# Patient Record
Sex: Female | Born: 1940 | Race: Black or African American | Hispanic: No | Marital: Single | Smoking: Never smoker
Health system: Southern US, Community
[De-identification: ages and names within clinical notes are randomized; demographics above are authoritative.]

## PROBLEM LIST (undated history)

## (undated) DIAGNOSIS — I5032 Chronic diastolic (congestive) heart failure: Secondary | ICD-10-CM

## (undated) DIAGNOSIS — Z9119 Patient's noncompliance with other medical treatment and regimen: Secondary | ICD-10-CM

## (undated) DIAGNOSIS — G629 Polyneuropathy, unspecified: Secondary | ICD-10-CM

## (undated) DIAGNOSIS — R748 Abnormal levels of other serum enzymes: Secondary | ICD-10-CM

## (undated) DIAGNOSIS — N189 Chronic kidney disease, unspecified: Secondary | ICD-10-CM

## (undated) DIAGNOSIS — E7800 Pure hypercholesterolemia, unspecified: Secondary | ICD-10-CM

## (undated) DIAGNOSIS — M1990 Unspecified osteoarthritis, unspecified site: Secondary | ICD-10-CM

## (undated) DIAGNOSIS — E119 Type 2 diabetes mellitus without complications: Secondary | ICD-10-CM

## (undated) DIAGNOSIS — Z7982 Long term (current) use of aspirin: Secondary | ICD-10-CM

## (undated) DIAGNOSIS — R0989 Other specified symptoms and signs involving the circulatory and respiratory systems: Secondary | ICD-10-CM

## (undated) DIAGNOSIS — F411 Generalized anxiety disorder: Secondary | ICD-10-CM

## (undated) DIAGNOSIS — I739 Peripheral vascular disease, unspecified: Secondary | ICD-10-CM

## (undated) DIAGNOSIS — M109 Gout, unspecified: Secondary | ICD-10-CM

## (undated) DIAGNOSIS — K219 Gastro-esophageal reflux disease without esophagitis: Secondary | ICD-10-CM

## (undated) DIAGNOSIS — Z91199 Patient's noncompliance with other medical treatment and regimen due to unspecified reason: Secondary | ICD-10-CM

## (undated) DIAGNOSIS — R0602 Shortness of breath: Secondary | ICD-10-CM

## (undated) DIAGNOSIS — I059 Rheumatic mitral valve disease, unspecified: Secondary | ICD-10-CM

## (undated) DIAGNOSIS — R51 Headache: Secondary | ICD-10-CM

## (undated) DIAGNOSIS — I10 Essential (primary) hypertension: Secondary | ICD-10-CM

## (undated) DIAGNOSIS — R011 Cardiac murmur, unspecified: Secondary | ICD-10-CM

## (undated) HISTORY — DX: Rheumatic mitral valve disease, unspecified: I05.9

## (undated) HISTORY — PX: OTHER SURGICAL HISTORY: SHX169

## (undated) HISTORY — DX: Patient's noncompliance with other medical treatment and regimen: Z91.19

## (undated) HISTORY — DX: Essential (primary) hypertension: I10

## (undated) HISTORY — DX: Generalized anxiety disorder: F41.1

## (undated) HISTORY — PX: CARDIAC CATHETERIZATION: SHX172

## (undated) HISTORY — DX: Long term (current) use of aspirin: Z79.82

## (undated) HISTORY — DX: Type 2 diabetes mellitus without complications: E11.9

## (undated) HISTORY — DX: Pure hypercholesterolemia, unspecified: E78.00

## (undated) HISTORY — DX: Cardiac murmur, unspecified: R01.1

## (undated) HISTORY — PX: ABDOMINAL HYSTERECTOMY: SHX81

## (undated) HISTORY — DX: Patient's noncompliance with other medical treatment and regimen due to unspecified reason: Z91.199

## (undated) HISTORY — DX: Abnormal levels of other serum enzymes: R74.8

## (undated) HISTORY — PX: COLONOSCOPY: SHX174

---

## 2012-01-30 DIAGNOSIS — R012 Other cardiac sounds: Secondary | ICD-10-CM

## 2012-01-30 DIAGNOSIS — R7989 Other specified abnormal findings of blood chemistry: Secondary | ICD-10-CM

## 2012-01-31 DIAGNOSIS — I219 Acute myocardial infarction, unspecified: Secondary | ICD-10-CM

## 2012-02-01 DIAGNOSIS — R0602 Shortness of breath: Secondary | ICD-10-CM

## 2012-02-01 DIAGNOSIS — I10 Essential (primary) hypertension: Secondary | ICD-10-CM

## 2012-02-02 DIAGNOSIS — I214 Non-ST elevation (NSTEMI) myocardial infarction: Secondary | ICD-10-CM

## 2012-02-21 ENCOUNTER — Encounter: Payer: Self-pay | Admitting: Cardiovascular Disease

## 2012-02-22 ENCOUNTER — Ambulatory Visit (INDEPENDENT_AMBULATORY_CARE_PROVIDER_SITE_OTHER): Payer: 59 | Admitting: Cardiovascular Disease

## 2012-02-22 ENCOUNTER — Encounter: Payer: Self-pay | Admitting: Cardiovascular Disease

## 2012-02-22 DIAGNOSIS — I252 Old myocardial infarction: Secondary | ICD-10-CM

## 2012-02-22 DIAGNOSIS — I10 Essential (primary) hypertension: Secondary | ICD-10-CM

## 2012-02-22 NOTE — Patient Instructions (Signed)
Your physician recommends that you schedule a follow-up appointment as needed.   Your physician recommends that you continue on your current medications as directed. Please refer to the Current Medication list given to you today.  

## 2012-02-24 ENCOUNTER — Encounter: Payer: Self-pay | Admitting: Cardiovascular Disease

## 2012-02-24 DIAGNOSIS — I252 Old myocardial infarction: Secondary | ICD-10-CM | POA: Insufficient documentation

## 2012-02-24 DIAGNOSIS — I10 Essential (primary) hypertension: Secondary | ICD-10-CM | POA: Insufficient documentation

## 2012-02-24 NOTE — Progress Notes (Signed)
HPI  This is a 71 year old female who is here today for followup visit. She was seen recently at [facility] where she presented with hypertensive urgency. She had a similar presentation one week prior at [facility]. Her systolic blood pressure was close to 200. Her cardiac enzymes were mildly elevated and that was felt to be due to uncontrolled hypertension. She had an echocardiogram done which showed normal LV systolic function with mild LVOT obstruction. Supposedly, she had cardiac catheterization in 2007 in [place] which showed no significant coronary artery disease. She also informed me that she had a stress test done last year which was also unremarkable. During her hospitalization, we took her off clonidine. Labetalol was switched to carvedilol and hydrochlorothiazide was switched to chlorthalidone. Also hydralazine was added. Her blood pressure has been more controlled since then. She saw her primary care physician Dr. Nelson Chimes  who switched her back from carvedilol to labetalol. Overall, she is feeling reasonably well. She has chronic complaints of headache and dizziness. She also has some nausea. It appears that she was found to have gallstones recently in her abdominal ultrasound.  No Known Allergies   Current Outpatient Prescriptions on File Prior to Visit  Medication Sig Dispense Refill  . aspirin 81 MG tablet Take 81 mg by mouth daily.      Marland Kitchen docusate sodium (COLACE) 100 MG capsule Take 100 mg by mouth at bedtime as needed.       . ETODOLAC PO Take 500 mg by mouth 2 (two) times daily.       . hydrALAZINE (APRESOLINE) 50 MG tablet Take 50 mg by mouth 3 (three) times daily.      . potassium chloride SA (K-DUR,KLOR-CON) 20 MEQ tablet Take 20 mEq by mouth 2 (two) times daily.      Marland Kitchen pyridoxine (B-6) 100 MG tablet Take 100 mg by mouth daily.      . traMADol (ULTRAM) 50 MG tablet Take 50 mg by mouth every 6 (six) hours as needed.      . cloNIDine (CATAPRES) 0.2 MG tablet  Take 0.2 mg by mouth 2 (two) times daily.      . Triamterene-HCTZ (DYAZIDE PO) Take 12.5 mg by mouth 2 (two) times daily.         Past Medical History  Diagnosis Date  . Other nonspecific abnormal serum enzyme levels   . Anxiety state, unspecified   . Pure hypercholesterolemia   . Mitral valve disorders   . Type II or unspecified type diabetes mellitus without mention of complication, not stated as uncontrolled   . Personal history of noncompliance with medical treatment, presenting hazards to health   . Encounter for long-term (current) use of aspirin   . Murmur, cardiac     Mild LVOT obstruction  . Essential hypertension, malignant      Past Surgical History  Procedure Date  . Abdominal hysterectomy   . Cardiac catheterization      Family History  Problem Relation Age of Onset  . Coronary artery disease      Family History   . Heart attack Father 1     History   Social History  . Marital Status: Single    Spouse Name: N/A    Number of Children: N/A  . Years of Education: N/A   Occupational History  . Not on file.   Social History Main Topics  . Smoking status: Never Smoker   . Smokeless tobacco: Never Used  . Alcohol Use: No  .  Drug Use: No  . Sexually Active: Not on file   Other Topics Concern  . Not on file   Social History Narrative   Lives with her son in [place], [place]     PHYSICAL EXAM   BP 114/76  Pulse 70  Ht 5\' 7"  (1.702 m)  Wt 171 lb (77.565 kg)  BMI 26.78 kg/m2  Constitutional: She is oriented to person, place, and time. She appears well-developed and well-nourished. No distress.  HENT: No nasal discharge.  Head: Normocephalic and atraumatic.  Eyes: Pupils are equal and round. Right eye exhibits no discharge. Left eye exhibits no discharge.  Neck: Normal range of motion. Neck supple. No JVD present. No thyromegaly present.  Cardiovascular: Normal rate, regular rhythm, normal heart sounds. Exam reveals no gallop and no friction  rub. There is a 2/6 systolic ejection murmur at the aortic area.  Pulmonary/Chest: Effort normal and breath sounds normal. No stridor. No respiratory distress. She has no wheezes. She has no rales. She exhibits no tenderness.  Abdominal: Soft. Bowel sounds are normal. She exhibits no distension. There is no tenderness. There is no rebound and no guarding.  Musculoskeletal: Normal range of motion. She exhibits no edema and no tenderness.  Neurological: She is alert and oriented to person, place, and time. Coordination normal.  Skin: Skin is warm and dry. No rash noted. She is not diaphoretic. No erythema. No pallor.  Psychiatric: She has a normal mood and affect. Her behavior is normal. Judgment and thought content normal.      ASSESSMENT AND PLAN

## 2012-02-24 NOTE — Assessment & Plan Note (Signed)
Her blood pressure seems to be well-controlled on current medications. I favor that she stays off clonidine. We switched her from labetalol to carvedilol which is easier to up titrate. However, she seems to be stable on current dose of labetalol and will leave her on it. She is following up closely with her primary care physician Dr. Nelson Chimes in [place].

## 2012-02-24 NOTE — Assessment & Plan Note (Signed)
The patient had recent elevation in cardiac enzymes in the setting of severe hypertension which likely represented a type II non-ST elevation myocardial infarction due to supply demand ischemia. It appears that she has no history of obstructive coronary artery disease based on her ischemic workup in the past. I recommend blood pressure control. I am not planning on ischemic evaluation at this time. She denies symptoms of chest pain. If cholecystectomy is needed she is likely at low to moderate risk for cardiac complications. It's preferable to wait another few weeks and to make sure that her blood pressure continues to be controlled.

## 2014-06-11 NOTE — Progress Notes (Signed)
Matt BelmontBabish, [place] - Please enter preop orders in Epic for [employer] Abdulla - surgery date 7/7  And [facility] for [facility].  Thanks.

## 2014-06-12 ENCOUNTER — Encounter (HOSPITAL_COMMUNITY): Payer: Self-pay | Admitting: Pharmacy Technician

## 2014-06-18 NOTE — Patient Instructions (Signed)
Teresa Coffey  06/18/2014   Your procedure is scheduled on:  07/02/2014  220pm-330pm  Report to [facility].  Follow the Signs to Short Stay Center at    1120    am  Call this number if you have problems the morning of surgery: [phone]   Remember:   Do not eat food after midnite.  May have clear liquids until 0730am then npo.    Take these medicines the morning of surgery with A SIP OF WATER:    Do not wear jewelry, make-up or nail polish.  Do not wear lotions, powders, or perfumes, deodorant   Do not shave 48 hours prior to surgery.   Do not bring valuables to the hospital.  Contacts, dentures or bridgework may not be worn into surgery.  Leave suitcase in the car. After surgery it may be brought to your room.  For patients admitted to the hospital, checkout time is 11:00 AM the day of  discharge.    CLEAR LIQUID DIET   Foods Allowed                                                                     Foods Excluded  Coffee and tea, regular and decaf                             liquids that you cannot  Plain Jell-O in any flavor                                             see through such as: Fruit ices (not with fruit pulp)                                     milk, soups, orange juice  Iced Popsicles                                    All solid food Carbonated beverages, regular and diet                                    Cranberry, grape and apple juices Sports drinks like Gatorade Lightly seasoned clear broth or consume(fat free) Sugar, honey syrup  Sample Menu Breakfast                                Lunch                                     Supper Cranberry juice                    Beef broth  Chicken broth Jell-O                                     Grape juice                           Apple juice Coffee or tea                        Jell-O                                      Popsicle  Coffee or tea                        Coffee or tea  _____________________________________________________________________   WHAT IS A BLOOD TRANSFUSION? Blood Transfusion Information  A transfusion is the replacement of blood or some of its parts. Blood is made up of multiple cells which provide different functions.  Red blood cells carry oxygen and are used for blood loss replacement.  Nofsinger blood cells fight against infection.  Platelets control bleeding.  Plasma helps clot blood.  Other blood products are available for specialized needs, such as hemophilia or other clotting disorders. BEFORE THE TRANSFUSION  Who gives blood for transfusions?   Healthy volunteers who are fully evaluated to make sure their blood is safe. This is blood bank blood. Transfusion therapy is the safest it has ever been in the practice of medicine. Before blood is taken from a donor, a complete history is taken to make sure that person has no history of diseases nor engages in risky social behavior (examples are intravenous drug use or sexual activity with multiple partners). The donor's travel history is screened to minimize risk of transmitting infections, such as malaria. The donated blood is tested for signs of infectious diseases, such as HIV and hepatitis. The blood is then tested to be sure it is compatible with you in order to minimize the chance of a transfusion reaction. If you or a relative donates blood, this is often done in anticipation of surgery and is not appropriate for emergency situations. It takes many days to process the donated blood. RISKS AND COMPLICATIONS Although transfusion therapy is very safe and saves many lives, the main dangers of transfusion include:   Getting an infectious disease.  Developing a transfusion reaction. This is an allergic reaction to something in the blood you were given. Every precaution is taken to prevent this. The decision to have a blood transfusion has  been considered carefully by your caregiver before blood is given. Blood is not given unless the benefits outweigh the risks. AFTER THE TRANSFUSION  Right after receiving a blood transfusion, you will usually feel much better and more energetic. This is especially true if your red blood cells have gotten low (anemic). The transfusion raises the level of the red blood cells which carry oxygen, and this usually causes an energy increase.  The nurse administering the transfusion will monitor you carefully for complications. HOME CARE INSTRUCTIONS  No special instructions are needed after a transfusion. You may find your energy is better. Speak with your caregiver about any limitations on activity for underlying diseases you may have. SEEK MEDICAL CARE IF:   Your condition is not improving after  your transfusion.  You develop redness or irritation at the intravenous (IV) site. SEEK IMMEDIATE MEDICAL CARE IF:  Any of the following symptoms occur over the next 12 hours:  Shaking chills.  You have a temperature by mouth above 102 F (38.9 C), not controlled by medicine.  Chest, back, or muscle pain.  People around you feel you are not acting correctly or are confused.  Shortness of breath or difficulty breathing.  Dizziness and fainting.  You get a rash or develop hives.  You have a decrease in urine output.  Your urine turns a dark color or changes to pink, red, or brown. Any of the following symptoms occur over the next 10 days:  You have a temperature by mouth above 102 F (38.9 C), not controlled by medicine.  Shortness of breath.  Weakness after normal activity.  The Rudie part of the eye turns yellow (jaundice).  You have a decrease in the amount of urine or are urinating less often.  Your urine turns a dark color or changes to pink, red, or brown. Document Released: 12/10/2000 Document Revised: 03/06/2012 Document Reviewed: 07/29/2008 ExitCare Patient Information  2014 [employer].  _______________________________________________________________________[facility] - Preparing for Surgery Before surgery, you can play an important role.  Because skin is not sterile, your skin needs to be as free of germs as possible.  You can reduce the number of germs on your skin by washing with CHG (chlorahexidine gluconate) soap before surgery.  CHG is an antiseptic cleaner which kills germs and bonds with the skin to continue killing germs even after washing. Please DO NOT use if you have an allergy to CHG or antibacterial soaps.  If your skin becomes reddened/irritated stop using the CHG and inform your nurse when you arrive at Short Stay. Do not shave (including legs and underarms) for at least 48 hours prior to the first CHG shower.  You may shave your face/neck. Please follow these instructions carefully:  1.  Shower with CHG Soap the night before surgery and the  morning of Surgery.  2.  If you choose to wash your hair, wash your hair first as usual with your  normal  shampoo.  3.  After you shampoo, rinse your hair and body thoroughly to remove the  shampoo.                           4.  Use CHG as you would any other liquid soap.  You can apply chg directly  to the skin and wash                       Gently with a scrungie or clean washcloth.  5.  Apply the CHG Soap to your body ONLY FROM THE NECK DOWN.   Do not use on face/ open                           Wound or open sores. Avoid contact with eyes, ears mouth and genitals (private parts).                       Wash face,  Genitals (private parts) with your normal soap.             6.  Wash thoroughly, paying special attention to the area where your surgery  will be performed.  7.  Thoroughly rinse your  body with warm water from the neck down.  8.  DO NOT shower/wash with your normal soap after using and rinsing off  the CHG Soap.                9.  Pat yourself dry with a clean towel.            10.  Wear  clean pajamas.            11.  Place clean sheets on your bed the night of your first shower and do not  sleep with pets. Day of Surgery : Do not apply any lotions/deodorants the morning of surgery.  Please wear clean clothes to the hospital/surgery center.  FAILURE TO FOLLOW THESE INSTRUCTIONS MAY RESULT IN THE CANCELLATION OF YOUR SURGERY PATIENT SIGNATURE_________________________________  NURSE SIGNATURE__________________________________  ________________________________________________________________________   Rogelia MireIncentive Spirometer  An incentive spirometer is a tool that can help keep your lungs clear and active. This tool measures how well you are filling your lungs with each breath. Taking long deep breaths may help reverse or decrease the chance of developing breathing (pulmonary) problems (especially infection) following:  A long period of time when you are unable to move or be active. BEFORE THE PROCEDURE   If the spirometer includes an indicator to show your best effort, your nurse or respiratory therapist will set it to a desired goal.  If possible, sit up straight or lean slightly forward. Try not to slouch.  Hold the incentive spirometer in an upright position. INSTRUCTIONS FOR USE  1. Sit on the edge of your bed if possible, or sit up as far as you can in bed or on a chair. 2. Hold the incentive spirometer in an upright position. 3. Breathe out normally. 4. Place the mouthpiece in your mouth and seal your lips tightly around it. 5. Breathe in slowly and as deeply as possible, raising the piston or the ball toward the top of the column. 6. Hold your breath for 3-5 seconds or for as long as possible. Allow the piston or ball to fall to the bottom of the column. 7. Remove the mouthpiece from your mouth and breathe out normally. 8. Rest for a few seconds and repeat Steps 1 through 7 at least 10 times every 1-2 hours when you are awake. Take your time and take a few normal  breaths between deep breaths. 9. The spirometer may include an indicator to show your best effort. Use the indicator as a goal to work toward during each repetition. 10. After each set of 10 deep breaths, practice coughing to be sure your lungs are clear. If you have an incision (the cut made at the time of surgery), support your incision when coughing by placing a pillow or rolled up towels firmly against it. Once you are able to get out of bed, walk around indoors and cough well. You may stop using the incentive spirometer when instructed by your caregiver.  RISKS AND COMPLICATIONS  Take your time so you do not get dizzy or light-headed.  If you are in pain, you may need to take or ask for pain medication before doing incentive spirometry. It is harder to take a deep breath if you are having pain. AFTER USE  Rest and breathe slowly and easily.  It can be helpful to keep track of a log of your progress. Your caregiver can provide you with a simple table to help with this. If you are using the spirometer at home,  follow these instructions: SEEK MEDICAL CARE IF:   You are having difficultly using the spirometer.  You have trouble using the spirometer as often as instructed.  Your pain medication is not giving enough relief while using the spirometer.  You develop fever of 100.5 F (38.1 C) or higher. SEEK IMMEDIATE MEDICAL CARE IF:   You cough up bloody sputum that had not been present before.  You develop fever of 102 F (38.9 C) or greater.  You develop worsening pain at or near the incision site. MAKE SURE YOU:   Understand these instructions.  Will watch your condition.  Will get help right away if you are not doing well or get worse. Document Released: 04/25/2007 Document Revised: 03/06/2012 Document Reviewed: 06/26/2007 ExitCare Patient Information 2014 [employer]Care, [place].   ________________________________________________________________________     Please read over  the following fact sheets that you were given: MRSA Information, coughing and deep breathing exercises, leg exercises

## 2014-06-20 ENCOUNTER — Encounter (HOSPITAL_COMMUNITY)
Admission: RE | Admit: 2014-06-20 | Discharge: 2014-06-20 | Disposition: A | Discharge status: Home / Self Care | Payer: 59 | Source: Ambulatory Visit | Attending: Orthopedic Surgery | Admitting: Orthopedic Surgery

## 2014-06-20 ENCOUNTER — Encounter (HOSPITAL_COMMUNITY): Payer: Self-pay

## 2014-06-20 ENCOUNTER — Encounter (INDEPENDENT_AMBULATORY_CARE_PROVIDER_SITE_OTHER): Payer: Self-pay

## 2014-06-20 ENCOUNTER — Ambulatory Visit (HOSPITAL_COMMUNITY)
Admission: RE | Admit: 2014-06-20 | Discharge: 2014-06-20 | Disposition: A | Discharge status: Home / Self Care | Payer: 59 | Source: Ambulatory Visit | Attending: Orthopedic Surgery | Admitting: Orthopedic Surgery

## 2014-06-20 DIAGNOSIS — IMO0002 Reserved for concepts with insufficient information to code with codable children: Secondary | ICD-10-CM | POA: Insufficient documentation

## 2014-06-20 DIAGNOSIS — I10 Essential (primary) hypertension: Secondary | ICD-10-CM | POA: Insufficient documentation

## 2014-06-20 DIAGNOSIS — Z01818 Encounter for other preprocedural examination: Secondary | ICD-10-CM | POA: Insufficient documentation

## 2014-06-20 DIAGNOSIS — Z01812 Encounter for preprocedural laboratory examination: Secondary | ICD-10-CM | POA: Insufficient documentation

## 2014-06-20 HISTORY — DX: Headache: R51

## 2014-06-20 HISTORY — DX: Unspecified osteoarthritis, unspecified site: M19.90

## 2014-06-20 HISTORY — DX: Peripheral vascular disease, unspecified: I73.9

## 2014-06-20 HISTORY — DX: Chronic kidney disease, unspecified: N18.9

## 2014-06-20 HISTORY — DX: Other specified symptoms and signs involving the circulatory and respiratory systems: R09.89

## 2014-06-20 HISTORY — DX: Shortness of breath: R06.02

## 2014-06-20 HISTORY — DX: Gout, unspecified: M10.9

## 2014-06-20 HISTORY — DX: Polyneuropathy, unspecified: G62.9

## 2014-06-20 HISTORY — DX: Chronic diastolic (congestive) heart failure: I50.32

## 2014-06-20 HISTORY — DX: Gastro-esophageal reflux disease without esophagitis: K21.9

## 2014-06-20 LAB — URINALYSIS, ROUTINE W REFLEX MICROSCOPIC
BILIRUBIN URINE: NEGATIVE
Glucose, UA: NEGATIVE mg/dL
HGB URINE DIPSTICK: NEGATIVE
Ketones, ur: NEGATIVE mg/dL
Nitrite: NEGATIVE
PROTEIN: NEGATIVE mg/dL
Specific Gravity, Urine: 1.014 (ref 1.005–1.030)
UROBILINOGEN UA: 1 mg/dL (ref 0.0–1.0)
pH: 5.5 (ref 5.0–8.0)

## 2014-06-20 LAB — BASIC METABOLIC PANEL
BUN: 15 mg/dL (ref 6–23)
CO2: 23 mEq/L (ref 19–32)
Calcium: 9.6 mg/dL (ref 8.4–10.5)
Chloride: 108 mEq/L (ref 96–112)
Creatinine, Ser: 0.98 mg/dL (ref 0.50–1.10)
GFR, EST AFRICAN AMERICAN: 64 mL/min — AB (ref >90)
GFR, EST NON AFRICAN AMERICAN: 55 mL/min — AB (ref >90)
Glucose, Bld: 118 mg/dL — ABNORMAL HIGH (ref 70–99)
POTASSIUM: 4.4 meq/L (ref 3.7–5.3)
SODIUM: 144 meq/L (ref 137–147)

## 2014-06-20 LAB — CBC
HCT: 34.1 % — ABNORMAL LOW (ref 36.0–46.0)
Hemoglobin: 11.8 g/dL — ABNORMAL LOW (ref 12.0–15.0)
MCH: 31.3 pg (ref 26.0–34.0)
MCHC: 34.6 g/dL (ref 30.0–36.0)
MCV: 90.5 fL (ref 78.0–100.0)
Platelets: 278 10*3/uL (ref 150–400)
RBC: 3.77 MIL/uL — AB (ref 3.87–5.11)
RDW: 12.7 % (ref 11.5–15.5)
WBC: 5.3 10*3/uL (ref 4.0–10.5)

## 2014-06-20 LAB — PROTIME-INR
INR: 1.13 (ref 0.00–1.49)
PROTHROMBIN TIME: 14.5 s (ref 11.6–15.2)

## 2014-06-20 LAB — SURGICAL PCR SCREEN
MRSA, PCR: NEGATIVE
Staphylococcus aureus: NEGATIVE

## 2014-06-20 LAB — URINE MICROSCOPIC-ADD ON

## 2014-06-20 LAB — APTT: aPTT: 35 seconds (ref 24–37)

## 2014-06-20 NOTE — Progress Notes (Signed)
LOV with Dr Nelson ChimesAmin on chart  EKG 01/22/14 on chart

## 2014-06-20 NOTE — Progress Notes (Signed)
Urinalysis and micro results faxed and sent via Inbasket to Dr Charlann Boxerlin.

## 2014-06-20 NOTE — H&P (Signed)
TOTAL KNEE ADMISSION H&P  Patient is being admitted for left total knee arthroplasty.  Subjective:  Chief Complaint:    Left knee OA / pain.  HPI: Teresa Coffey, 73 y.o. female, has a history of pain and functional disability in the left knee due to trauma and arthritis and has failed non-surgical conservative treatments for greater than 12 weeks to include NSAID's and/or analgesics, corticosteriod injections, use of assistive devices and activity modification.  Onset of symptoms was gradual, starting years ago with gradually worsening course since that time. The patient noted no past surgery on the left knee(s).  Patient currently rates pain in the left knee(s) at 10 out of 10 with activity. Patient has night pain, worsening of pain with activity and weight bearing, pain that interferes with activities of daily living, pain with passive range of motion, crepitus and joint swelling.  Patient has evidence of periarticular osteophytes and joint space narrowing by imaging studies. There is no active infection.  Risks, benefits and expectations were discussed with the patient.  Risks including but not limited to the risk of anesthesia, blood clots, nerve damage, blood vessel damage, failure of the prosthesis, infection and up to and including death.  Patient understand the risks, benefits and expectations and wishes to proceed with surgery.   PCP: HAMA AMIN, ALI M., MD  D/C Plans:      Home with HHPT  Post-op Meds:       No Rx given   Tranexamic Acid:      Is to be given  Decadron:      Not to be given - DM  FYI:     ASA post-op  Norco post-op   Patient Active Problem List   Diagnosis Date Noted  . History of non-ST elevation myocardial infarction (NSTEMI) 02/24/2012  . Essential hypertension, malignant    Past Medical History  Diagnosis Date  . Other nonspecific abnormal serum enzyme levels   . Anxiety state, unspecified   . Pure hypercholesterolemia   . Mitral valve disorders   .  Personal history of noncompliance with medical treatment, presenting hazards to health   . Encounter for long-term (current) use of aspirin   . Murmur, cardiac     Mild LVOT obstruction  . Essential hypertension, malignant   . Peripheral vascular disease   . Shortness of breath     with exertion   . Type II or unspecified type diabetes mellitus without mention of complication, not stated as uncontrolled     not on meds per patient  . GERD (gastroesophageal reflux disease)   . Headache(784.0)   . Arthritis     Past Surgical History  Procedure Laterality Date  . Abdominal hysterectomy    . Cardiac catheterization    . Right foot surgery       No prescriptions prior to admission   Allergies  Allergen Reactions  . Lisinopril Cough    History  Substance Use Topics  . Smoking status: Never Smoker   . Smokeless tobacco: Never Used  . Alcohol Use: No    Family History  Problem Relation Age of Onset  . Coronary artery disease      Family History   . Heart attack Father [id number]     Review of Systems  Constitutional: Negative.   Eyes: Negative.   Respiratory: Positive for shortness of breath (with exertion).   Cardiovascular: Negative.   Gastrointestinal: Positive for heartburn.  Genitourinary: Negative.   Musculoskeletal: Positive for joint pain.  Skin:  Negative.   Neurological: Positive for headaches.  Endo/Heme/Allergies: Negative.   Psychiatric/Behavioral: The patient is nervous/anxious.     Objective:  Physical Exam  Constitutional: She is oriented to person, place, and time. She appears well-developed and well-nourished.  HENT:  Head: Normocephalic and atraumatic.  Mouth/Throat: Oropharynx is clear and moist.  Eyes: Pupils are equal, round, and reactive to light.  Neck: Neck supple. No JVD present. No tracheal deviation present. No thyromegaly present.  Cardiovascular: Normal rate, regular rhythm and intact distal pulses.   Murmur heard. Respiratory: Effort  normal and breath sounds normal. No stridor. No respiratory distress. She has no wheezes.  GI: Soft. There is no tenderness. There is no guarding.  Musculoskeletal:       Left knee: She exhibits decreased range of motion, swelling and bony tenderness. She exhibits no ecchymosis, no deformity, no laceration and no erythema. Tenderness found.  Lymphadenopathy:    She has no cervical adenopathy.  Neurological: She is alert and oriented to person, place, and time.  Skin: Skin is warm and dry.  Psychiatric: She has a normal mood and affect.    Vital signs in last 24 hours: Temp:  [97.9 F (36.6 C)] 97.9 F (36.6 C) (06/25 1023) Pulse Rate:  [64] 64 (06/25 1023) Resp:  [16] 16 (06/25 1023) BP: (151)/(56) 151/56 mmHg (06/25 1023) SpO2:  [99 %] 99 % (06/25 1023) Weight:  [79.379 kg (175 lb)] 79.379 kg (175 lb) (06/25 1023)  Labs:   Estimated body mass index is 26.78 kg/(m^2) as calculated from the following:   Height as of 02/22/12: 5\' 7"  (1.702 m).   Weight as of 02/22/12: 77.565 kg (171 lb).   Imaging Review Plain radiographs demonstrate severe degenerative joint disease of the left knee(s). The overall alignment is neutral. The bone quality appears to be good for age and reported activity level.  Assessment/Plan:  End stage arthritis, left knee   The patient history, physical examination, clinical judgment of the Lazar Tierce and imaging studies are consistent with end stage degenerative joint disease of the left knee(s) and total knee arthroplasty is deemed medically necessary. The treatment options including medical management, injection therapy arthroscopy and arthroplasty were discussed at length. The risks and benefits of total knee arthroplasty were presented and reviewed. The risks due to aseptic loosening, infection, stiffness, patella tracking problems, thromboembolic complications and other imponderables were discussed. The patient acknowledged the explanation, agreed to proceed  with the plan and consent was signed. Patient is being admitted for inpatient treatment for surgery, pain control, PT, OT, prophylactic antibiotics, VTE prophylaxis, progressive ambulation and ADL's and discharge planning. The patient is planning to be discharged home with home health services.     Anastasio AuerbachMatthew S. Babish   PA-C  06/20/2014, 3:28 PM

## 2014-06-21 ENCOUNTER — Encounter (HOSPITAL_COMMUNITY): Payer: Self-pay

## 2014-07-02 ENCOUNTER — Encounter (HOSPITAL_COMMUNITY)
Admission: RE | Disposition: A | Discharge status: Home Health | Payer: Self-pay | Source: Ambulatory Visit | Attending: Orthopedic Surgery

## 2014-07-02 ENCOUNTER — Encounter (HOSPITAL_COMMUNITY): Payer: PRIVATE HEALTH INSURANCE | Admitting: Registered Nurse

## 2014-07-02 ENCOUNTER — Inpatient Hospital Stay (HOSPITAL_COMMUNITY)
Admission: RE | Admit: 2014-07-02 | Discharge: 2014-07-04 | DRG: 470 | Disposition: A | Discharge status: Home Health | Payer: PRIVATE HEALTH INSURANCE | Source: Ambulatory Visit | Attending: Orthopedic Surgery | Admitting: Orthopedic Surgery

## 2014-07-02 ENCOUNTER — Inpatient Hospital Stay (HOSPITAL_COMMUNITY): Payer: PRIVATE HEALTH INSURANCE | Admitting: Registered Nurse

## 2014-07-02 ENCOUNTER — Encounter (HOSPITAL_COMMUNITY): Payer: Self-pay | Admitting: *Deleted

## 2014-07-02 DIAGNOSIS — M1710 Unilateral primary osteoarthritis, unspecified knee: Principal | ICD-10-CM | POA: Diagnosis present

## 2014-07-02 DIAGNOSIS — Z91199 Patient's noncompliance with other medical treatment and regimen due to unspecified reason: Secondary | ICD-10-CM

## 2014-07-02 DIAGNOSIS — I059 Rheumatic mitral valve disease, unspecified: Secondary | ICD-10-CM | POA: Diagnosis present

## 2014-07-02 DIAGNOSIS — Z6827 Body mass index (BMI) 27.0-27.9, adult: Secondary | ICD-10-CM

## 2014-07-02 DIAGNOSIS — K219 Gastro-esophageal reflux disease without esophagitis: Secondary | ICD-10-CM | POA: Diagnosis present

## 2014-07-02 DIAGNOSIS — D62 Acute posthemorrhagic anemia: Secondary | ICD-10-CM | POA: Diagnosis not present

## 2014-07-02 DIAGNOSIS — I252 Old myocardial infarction: Secondary | ICD-10-CM

## 2014-07-02 DIAGNOSIS — R011 Cardiac murmur, unspecified: Secondary | ICD-10-CM | POA: Diagnosis present

## 2014-07-02 DIAGNOSIS — M25469 Effusion, unspecified knee: Secondary | ICD-10-CM | POA: Diagnosis present

## 2014-07-02 DIAGNOSIS — E663 Overweight: Secondary | ICD-10-CM | POA: Diagnosis present

## 2014-07-02 DIAGNOSIS — I739 Peripheral vascular disease, unspecified: Secondary | ICD-10-CM | POA: Diagnosis present

## 2014-07-02 DIAGNOSIS — M898X9 Other specified disorders of bone, unspecified site: Secondary | ICD-10-CM | POA: Diagnosis present

## 2014-07-02 DIAGNOSIS — Z9119 Patient's noncompliance with other medical treatment and regimen: Secondary | ICD-10-CM

## 2014-07-02 DIAGNOSIS — Z8249 Family history of ischemic heart disease and other diseases of the circulatory system: Secondary | ICD-10-CM

## 2014-07-02 DIAGNOSIS — E119 Type 2 diabetes mellitus without complications: Secondary | ICD-10-CM | POA: Diagnosis present

## 2014-07-02 DIAGNOSIS — Z96652 Presence of left artificial knee joint: Secondary | ICD-10-CM

## 2014-07-02 DIAGNOSIS — M6580 Other synovitis and tenosynovitis, unspecified site: Secondary | ICD-10-CM | POA: Diagnosis present

## 2014-07-02 DIAGNOSIS — Z888 Allergy status to other drugs, medicaments and biological substances status: Secondary | ICD-10-CM

## 2014-07-02 DIAGNOSIS — E7800 Pure hypercholesterolemia, unspecified: Secondary | ICD-10-CM | POA: Diagnosis present

## 2014-07-02 DIAGNOSIS — Z96659 Presence of unspecified artificial knee joint: Secondary | ICD-10-CM

## 2014-07-02 DIAGNOSIS — F411 Generalized anxiety disorder: Secondary | ICD-10-CM | POA: Diagnosis present

## 2014-07-02 DIAGNOSIS — I10 Essential (primary) hypertension: Secondary | ICD-10-CM | POA: Diagnosis present

## 2014-07-02 HISTORY — PX: TOTAL KNEE ARTHROPLASTY: SHX125

## 2014-07-02 LAB — TYPE AND SCREEN
ABO/RH(D): A NEG
ANTIBODY SCREEN: NEGATIVE

## 2014-07-02 LAB — GLUCOSE, CAPILLARY
GLUCOSE-CAPILLARY: 113 mg/dL — AB (ref 70–99)
GLUCOSE-CAPILLARY: 108 mg/dL — AB (ref 70–99)
GLUCOSE-CAPILLARY: 123 mg/dL — AB (ref 70–99)

## 2014-07-02 LAB — ABO/RH: ABO/RH(D): A NEG

## 2014-07-02 SURGERY — ARTHROPLASTY, KNEE, TOTAL
Anesthesia: Spinal | Site: Knee | Laterality: Left

## 2014-07-02 MED ORDER — KETOROLAC TROMETHAMINE 30 MG/ML IJ SOLN
INTRAMUSCULAR | Status: DC | PRN
  Administered 2014-07-02: 30 mg via INTRAVENOUS

## 2014-07-02 MED ORDER — CEFAZOLIN SODIUM-DEXTROSE 2-3 GM-% IV SOLR
INTRAVENOUS | Status: AC
  Filled 2014-07-02: qty 50

## 2014-07-02 MED ORDER — MIDAZOLAM HCL 2 MG/2ML IJ SOLN
INTRAMUSCULAR | Status: AC
  Filled 2014-07-02: qty 2

## 2014-07-02 MED ORDER — MIDAZOLAM HCL 5 MG/5ML IJ SOLN
INTRAMUSCULAR | Status: DC | PRN
  Administered 2014-07-02: 2 mg via INTRAVENOUS

## 2014-07-02 MED ORDER — PROPOFOL INFUSION 10 MG/ML OPTIME
INTRAVENOUS | Status: DC | PRN
  Administered 2014-07-02: 140 ug/kg/min via INTRAVENOUS

## 2014-07-02 MED ORDER — ONDANSETRON HCL 4 MG/2ML IJ SOLN
4.0000 mg | Freq: Four times a day (QID) | INTRAMUSCULAR | Status: DC | PRN
  Administered 2014-07-03: 4 mg via INTRAVENOUS
  Filled 2014-07-02: qty 2

## 2014-07-02 MED ORDER — ATORVASTATIN CALCIUM 20 MG PO TABS
20.0000 mg | ORAL_TABLET | Freq: Every day | ORAL | Status: DC
  Administered 2014-07-02 – 2014-07-04 (×3): 20 mg via ORAL
  Filled 2014-07-02 (×3): qty 1

## 2014-07-02 MED ORDER — BISACODYL 10 MG RE SUPP: 10.0000 mg | Freq: Every day | RECTAL | Status: DC | PRN

## 2014-07-02 MED ORDER — PANTOPRAZOLE SODIUM 40 MG PO TBEC
80.0000 mg | DELAYED_RELEASE_TABLET | Freq: Every day | ORAL | Status: DC
  Administered 2014-07-03 – 2014-07-04 (×2): 80 mg via ORAL
  Filled 2014-07-02 (×2): qty 2

## 2014-07-02 MED ORDER — ACETAMINOPHEN 10 MG/ML IV SOLN
1000.0000 mg | Freq: Once | INTRAVENOUS | Status: AC
  Administered 2014-07-02: 1000 mg via INTRAVENOUS
  Filled 2014-07-02: qty 100

## 2014-07-02 MED ORDER — ALLOPURINOL 100 MG PO TABS
100.0000 mg | ORAL_TABLET | Freq: Two times a day (BID) | ORAL | Status: DC
  Administered 2014-07-02 – 2014-07-04 (×4): 100 mg via ORAL
  Filled 2014-07-02 (×5): qty 1

## 2014-07-02 MED ORDER — AMLODIPINE BESYLATE 10 MG PO TABS
10.0000 mg | ORAL_TABLET | Freq: Every morning | ORAL | Status: DC
  Administered 2014-07-03 – 2014-07-04 (×2): 10 mg via ORAL
  Filled 2014-07-02 (×2): qty 1

## 2014-07-02 MED ORDER — CELECOXIB 200 MG PO CAPS
200.0000 mg | ORAL_CAPSULE | Freq: Two times a day (BID) | ORAL | Status: DC
  Administered 2014-07-03 – 2014-07-04 (×3): 200 mg via ORAL
  Filled 2014-07-02 (×6): qty 1

## 2014-07-02 MED ORDER — CYCLOSPORINE 0.05 % OP EMUL
1.0000 [drp] | Freq: Two times a day (BID) | OPHTHALMIC | Status: DC
  Administered 2014-07-03 – 2014-07-04 (×3): 1 [drp] via OPHTHALMIC
  Filled 2014-07-02 (×6): qty 1

## 2014-07-02 MED ORDER — PROPOFOL 10 MG/ML IV BOLUS
INTRAVENOUS | Status: DC | PRN
  Administered 2014-07-02 (×2): 20 mg via INTRAVENOUS

## 2014-07-02 MED ORDER — GABAPENTIN 300 MG PO CAPS
300.0000 mg | ORAL_CAPSULE | Freq: Three times a day (TID) | ORAL | Status: DC
  Administered 2014-07-02 – 2014-07-04 (×6): 300 mg via ORAL
  Filled 2014-07-02 (×7): qty 1

## 2014-07-02 MED ORDER — KETOROLAC TROMETHAMINE 30 MG/ML IJ SOLN
INTRAMUSCULAR | Status: AC
Start: 2014-07-02 — End: 2014-07-02
  Filled 2014-07-02: qty 1

## 2014-07-02 MED ORDER — DIPHENHYDRAMINE HCL 25 MG PO CAPS: 25.0000 mg | ORAL_CAPSULE | Freq: Four times a day (QID) | ORAL | Status: DC | PRN

## 2014-07-02 MED ORDER — ISOSORBIDE MONONITRATE ER 30 MG PO TB24
30.0000 mg | ORAL_TABLET | Freq: Every morning | ORAL | Status: DC
  Administered 2014-07-03 – 2014-07-04 (×2): 30 mg via ORAL
  Filled 2014-07-02 (×2): qty 1

## 2014-07-02 MED ORDER — DEXAMETHASONE SODIUM PHOSPHATE 10 MG/ML IJ SOLN
10.0000 mg | Freq: Once | INTRAMUSCULAR | Status: DC
  Filled 2014-07-02: qty 1

## 2014-07-02 MED ORDER — ONDANSETRON HCL 4 MG PO TABS: 4.0000 mg | ORAL_TABLET | Freq: Four times a day (QID) | ORAL | Status: DC | PRN

## 2014-07-02 MED ORDER — LACTATED RINGERS IV SOLN
INTRAVENOUS | Status: DC
  Administered 2014-07-02: 1000 mL via INTRAVENOUS
  Administered 2014-07-02: 15:00:00 via INTRAVENOUS

## 2014-07-02 MED ORDER — HYDROMORPHONE HCL PF 1 MG/ML IJ SOLN: 0.5000 mg | INTRAMUSCULAR | Status: DC | PRN

## 2014-07-02 MED ORDER — PROPOFOL 10 MG/ML IV BOLUS
INTRAVENOUS | Status: AC
  Filled 2014-07-02: qty 20

## 2014-07-02 MED ORDER — SODIUM CHLORIDE 0.9 % IV SOLN
INTRAVENOUS | Status: DC
  Administered 2014-07-02 – 2014-07-03 (×2): via INTRAVENOUS
  Filled 2014-07-02 (×5): qty 1000

## 2014-07-02 MED ORDER — FERROUS SULFATE 325 (65 FE) MG PO TABS
325.0000 mg | ORAL_TABLET | Freq: Three times a day (TID) | ORAL | Status: DC
  Administered 2014-07-04 (×3): 325 mg via ORAL
  Filled 2014-07-02 (×7): qty 1

## 2014-07-02 MED ORDER — METOCLOPRAMIDE HCL 5 MG/ML IJ SOLN: 5.0000 mg | Freq: Three times a day (TID) | INTRAMUSCULAR | Status: DC | PRN

## 2014-07-02 MED ORDER — CEFAZOLIN SODIUM-DEXTROSE 2-3 GM-% IV SOLR
2.0000 g | INTRAVENOUS | Status: AC
  Administered 2014-07-02: 2 g via INTRAVENOUS

## 2014-07-02 MED ORDER — CHLORHEXIDINE GLUCONATE 4 % EX LIQD: 60.0000 mL | Freq: Once | CUTANEOUS | Status: DC

## 2014-07-02 MED ORDER — CEFAZOLIN SODIUM-DEXTROSE 2-3 GM-% IV SOLR
2.0000 g | Freq: Four times a day (QID) | INTRAVENOUS | Status: AC
  Administered 2014-07-02 – 2014-07-03 (×2): 2 g via INTRAVENOUS
  Filled 2014-07-02 (×3): qty 50

## 2014-07-02 MED ORDER — BUPIVACAINE-EPINEPHRINE (PF) 0.25% -1:200000 IJ SOLN
INTRAMUSCULAR | Status: AC
  Filled 2014-07-02: qty 30

## 2014-07-02 MED ORDER — ASPIRIN EC 325 MG PO TBEC
325.0000 mg | DELAYED_RELEASE_TABLET | Freq: Two times a day (BID) | ORAL | Status: DC
Start: 2014-07-03 — End: 2014-07-04
  Administered 2014-07-03 – 2014-07-04 (×4): 325 mg via ORAL
  Filled 2014-07-02 (×5): qty 1

## 2014-07-02 MED ORDER — MAGNESIUM CITRATE PO SOLN: 1.0000 | Freq: Once | ORAL | Status: AC | PRN

## 2014-07-02 MED ORDER — SODIUM CHLORIDE 0.9 % IJ SOLN
INTRAMUSCULAR | Status: AC
  Filled 2014-07-02: qty 10

## 2014-07-02 MED ORDER — POTASSIUM CHLORIDE CRYS ER 20 MEQ PO TBCR
20.0000 meq | EXTENDED_RELEASE_TABLET | Freq: Every day | ORAL | Status: DC
  Administered 2014-07-03 – 2014-07-04 (×2): 20 meq via ORAL
  Filled 2014-07-02 (×2): qty 1

## 2014-07-02 MED ORDER — METHOCARBAMOL 500 MG PO TABS
500.0000 mg | ORAL_TABLET | Freq: Four times a day (QID) | ORAL | Status: DC | PRN
  Administered 2014-07-02 – 2014-07-03 (×3): 500 mg via ORAL
  Filled 2014-07-02 (×3): qty 1

## 2014-07-02 MED ORDER — LABETALOL HCL 200 MG PO TABS
200.0000 mg | ORAL_TABLET | Freq: Two times a day (BID) | ORAL | Status: DC
  Administered 2014-07-02 – 2014-07-04 (×4): 200 mg via ORAL
  Filled 2014-07-02 (×6): qty 1

## 2014-07-02 MED ORDER — PHENOL 1.4 % MT LIQD
1.0000 | OROMUCOSAL | Status: DC | PRN
  Filled 2014-07-02: qty 177

## 2014-07-02 MED ORDER — BUPIVACAINE-EPINEPHRINE (PF) 0.25% -1:200000 IJ SOLN
INTRAMUSCULAR | Status: DC | PRN
  Administered 2014-07-02: 30 mL

## 2014-07-02 MED ORDER — FLUTICASONE PROPIONATE 50 MCG/ACT NA SUSP
2.0000 | Freq: Every day | NASAL | Status: DC
  Administered 2014-07-03: 2 via NASAL
  Filled 2014-07-02: qty 16

## 2014-07-02 MED ORDER — 0.9 % SODIUM CHLORIDE (POUR BTL) OPTIME
TOPICAL | Status: DC | PRN
  Administered 2014-07-02: 1000 mL

## 2014-07-02 MED ORDER — HYDRALAZINE HCL 20 MG/ML IJ SOLN: 5.0000 mg | INTRAMUSCULAR | Status: DC | PRN

## 2014-07-02 MED ORDER — BUPIVACAINE LIPOSOME 1.3 % IJ SUSP
20.0000 mL | Freq: Once | INTRAMUSCULAR | Status: AC
  Administered 2014-07-02: 20 mL
  Filled 2014-07-02: qty 20

## 2014-07-02 MED ORDER — DOCUSATE SODIUM 100 MG PO CAPS
100.0000 mg | ORAL_CAPSULE | Freq: Two times a day (BID) | ORAL | Status: DC
  Administered 2014-07-02 – 2014-07-04 (×4): 100 mg via ORAL

## 2014-07-02 MED ORDER — SODIUM CHLORIDE 0.9 % IR SOLN
Status: DC | PRN
  Administered 2014-07-02: 1000 mL

## 2014-07-02 MED ORDER — CLONIDINE HCL 0.1 MG PO TABS
0.1000 mg | ORAL_TABLET | Freq: Once | ORAL | Status: AC
  Administered 2014-07-02: 0.1 mg via ORAL
  Filled 2014-07-02: qty 1

## 2014-07-02 MED ORDER — HYDROCODONE-ACETAMINOPHEN 7.5-325 MG PO TABS
1.0000 | ORAL_TABLET | ORAL | Status: DC
  Administered 2014-07-02: 2 via ORAL
  Administered 2014-07-02 – 2014-07-03 (×2): 1 via ORAL
  Administered 2014-07-03 (×3): 2 via ORAL
  Administered 2014-07-04 (×3): 1 via ORAL
  Filled 2014-07-02 (×2): qty 2
  Filled 2014-07-02: qty 1
  Filled 2014-07-02 (×3): qty 2
  Filled 2014-07-02 (×2): qty 1
  Filled 2014-07-02: qty 2

## 2014-07-02 MED ORDER — MENTHOL 3 MG MT LOZG
1.0000 | LOZENGE | OROMUCOSAL | Status: DC | PRN
  Filled 2014-07-02: qty 9

## 2014-07-02 MED ORDER — TRANEXAMIC ACID 100 MG/ML IV SOLN
1000.0000 mg | Freq: Once | INTRAVENOUS | Status: AC
  Administered 2014-07-02: 1000 mg via INTRAVENOUS
  Filled 2014-07-02: qty 10

## 2014-07-02 MED ORDER — CLONIDINE HCL 0.2 MG PO TABS
0.2000 mg | ORAL_TABLET | Freq: Two times a day (BID) | ORAL | Status: DC
  Administered 2014-07-02 – 2014-07-04 (×4): 0.2 mg via ORAL
  Filled 2014-07-02 (×6): qty 1

## 2014-07-02 MED ORDER — SUCRALFATE 1 G PO TABS
1.0000 g | ORAL_TABLET | Freq: Three times a day (TID) | ORAL | Status: DC
  Administered 2014-07-02 – 2014-07-04 (×8): 1 g via ORAL
  Filled 2014-07-02 (×10): qty 1

## 2014-07-02 MED ORDER — POLYETHYLENE GLYCOL 3350 17 G PO PACK
17.0000 g | PACK | Freq: Two times a day (BID) | ORAL | Status: DC
  Administered 2014-07-03 – 2014-07-04 (×3): 17 g via ORAL

## 2014-07-02 MED ORDER — METHOCARBAMOL 1000 MG/10ML IJ SOLN
500.0000 mg | Freq: Four times a day (QID) | INTRAVENOUS | Status: DC | PRN
  Filled 2014-07-02: qty 5

## 2014-07-02 MED ORDER — LIDOCAINE HCL (CARDIAC) 20 MG/ML IV SOLN
INTRAVENOUS | Status: AC
  Filled 2014-07-02: qty 5

## 2014-07-02 MED ORDER — SODIUM CHLORIDE 0.9 % IJ SOLN
INTRAMUSCULAR | Status: DC | PRN
  Administered 2014-07-02: 9 mL via INTRAVENOUS

## 2014-07-02 MED ORDER — FENTANYL CITRATE 0.05 MG/ML IJ SOLN
INTRAMUSCULAR | Status: DC | PRN
  Administered 2014-07-02 (×2): 50 ug via INTRAVENOUS

## 2014-07-02 MED ORDER — ALUM & MAG HYDROXIDE-SIMETH 200-200-20 MG/5ML PO SUSP
30.0000 mL | ORAL | Status: DC | PRN
  Administered 2014-07-02: 30 mL via ORAL
  Filled 2014-07-02 (×3): qty 30

## 2014-07-02 MED ORDER — METOCLOPRAMIDE HCL 10 MG PO TABS
5.0000 mg | ORAL_TABLET | Freq: Three times a day (TID) | ORAL | Status: DC | PRN
  Administered 2014-07-03: 10 mg via ORAL
  Filled 2014-07-02: qty 1

## 2014-07-02 MED ORDER — BUPIVACAINE IN DEXTROSE 0.75-8.25 % IT SOLN
INTRATHECAL | Status: DC | PRN
  Administered 2014-07-02: 2 mL via INTRATHECAL

## 2014-07-02 MED ORDER — CHLORTHALIDONE 25 MG PO TABS
25.0000 mg | ORAL_TABLET | Freq: Every day | ORAL | Status: DC
  Administered 2014-07-03 – 2014-07-04 (×2): 25 mg via ORAL
  Filled 2014-07-02 (×2): qty 1

## 2014-07-02 MED ORDER — FENTANYL CITRATE 0.05 MG/ML IJ SOLN
INTRAMUSCULAR | Status: AC
  Filled 2014-07-02: qty 2

## 2014-07-02 MED ORDER — ALPRAZOLAM 0.5 MG PO TABS: 0.5000 mg | ORAL_TABLET | Freq: Three times a day (TID) | ORAL | Status: DC | PRN

## 2014-07-02 MED ORDER — LOTEPREDNOL ETABONATE 0.5 % OP SUSP
1.0000 [drp] | Freq: Four times a day (QID) | OPHTHALMIC | Status: DC
  Administered 2014-07-03 (×2): 1 [drp] via OPHTHALMIC
  Filled 2014-07-02: qty 5

## 2014-07-02 MED ORDER — PROMETHAZINE HCL 25 MG PO TABS: 25.0000 mg | ORAL_TABLET | Freq: Three times a day (TID) | ORAL | Status: DC | PRN

## 2014-07-02 SURGICAL SUPPLY — 49 items
BAG ZIPLOCK 12X15 (MISCELLANEOUS) IMPLANT
BANDAGE ELASTIC 6 VELCRO ST LF (GAUZE/BANDAGES/DRESSINGS) ×3 IMPLANT
BANDAGE ESMARK 6X9 LF (GAUZE/BANDAGES/DRESSINGS) ×1 IMPLANT
BLADE SAW SGTL 13.0X1.19X90.0M (BLADE) ×3 IMPLANT
BNDG ESMARK 6X9 LF (GAUZE/BANDAGES/DRESSINGS) ×3
BONE CEMENT GENTAMICIN (Cement) ×6 IMPLANT
BOWL SMART MIX CTS (DISPOSABLE) ×3 IMPLANT
CAPT RP KNEE ×3 IMPLANT
CEMENT BONE GENTAMICIN 40 (Cement) ×2 IMPLANT
CUFF TOURN SGL QUICK 34 (TOURNIQUET CUFF) ×2
CUFF TRNQT CYL 34X4X40X1 (TOURNIQUET CUFF) ×1 IMPLANT
DERMABOND ADVANCED (GAUZE/BANDAGES/DRESSINGS) ×2
DERMABOND ADVANCED .7 DNX12 (GAUZE/BANDAGES/DRESSINGS) ×1 IMPLANT
DRAPE EXTREMITY T 121X128X90 (DRAPE) ×3 IMPLANT
DRAPE POUCH INSTRU U-SHP 10X18 (DRAPES) ×3 IMPLANT
DRAPE U-SHAPE 47X51 STRL (DRAPES) ×3 IMPLANT
DRSG AQUACEL AG ADV 3.5X10 (GAUZE/BANDAGES/DRESSINGS) ×3 IMPLANT
DURAPREP 26ML APPLICATOR (WOUND CARE) ×6 IMPLANT
ELECT REM PT RETURN 9FT ADLT (ELECTROSURGICAL) ×3
ELECTRODE REM PT RTRN 9FT ADLT (ELECTROSURGICAL) ×1 IMPLANT
FACESHIELD WRAPAROUND (MASK) ×21 IMPLANT
GLOVE BIO SURGEON STRL SZ7 (GLOVE) ×3 IMPLANT
GLOVE BIO SURGEON STRL SZ7.5 (GLOVE) ×3 IMPLANT
GLOVE BIOGEL PI IND STRL 7.5 (GLOVE) ×3 IMPLANT
GLOVE BIOGEL PI INDICATOR 7.5 (GLOVE) ×6
GLOVE ORTHO TXT STRL SZ7.5 (GLOVE) ×6 IMPLANT
GOWN STRL REUS W/TWL LRG LVL3 (GOWN DISPOSABLE) ×3 IMPLANT
GOWN STRL REUS W/TWL XL LVL3 (GOWN DISPOSABLE) ×9 IMPLANT
HANDPIECE INTERPULSE COAX TIP (DISPOSABLE) ×2
KIT BASIN OR (CUSTOM PROCEDURE TRAY) ×3 IMPLANT
MANIFOLD NEPTUNE II (INSTRUMENTS) ×3 IMPLANT
NDL SAFETY ECLIPSE 18X1.5 (NEEDLE) ×1 IMPLANT
NEEDLE HYPO 18GX1.5 SHARP (NEEDLE) ×2
PACK TOTAL JOINT (CUSTOM PROCEDURE TRAY) ×3 IMPLANT
POSITIONER SURGICAL ARM (MISCELLANEOUS) ×3 IMPLANT
SET HNDPC FAN SPRY TIP SCT (DISPOSABLE) ×1 IMPLANT
SET PAD KNEE POSITIONER (MISCELLANEOUS) ×3 IMPLANT
SUCTION FRAZIER 12FR DISP (SUCTIONS) ×3 IMPLANT
SUT MNCRL AB 4-0 PS2 18 (SUTURE) ×3 IMPLANT
SUT VIC AB 1 CT1 36 (SUTURE) ×3 IMPLANT
SUT VIC AB 2-0 CT1 27 (SUTURE) ×6
SUT VIC AB 2-0 CT1 TAPERPNT 27 (SUTURE) ×3 IMPLANT
SUT VLOC 180 0 24IN GS25 (SUTURE) ×3 IMPLANT
SYRINGE 60CC LL (MISCELLANEOUS) ×3 IMPLANT
TOWEL OR 17X26 10 PK STRL BLUE (TOWEL DISPOSABLE) ×3 IMPLANT
TOWEL OR NON WOVEN STRL DISP B (DISPOSABLE) ×3 IMPLANT
TRAY FOLEY CATH 14FRSI W/METER (CATHETERS) ×3 IMPLANT
WATER STERILE IRR 1500ML POUR (IV SOLUTION) ×3 IMPLANT
WRAP KNEE MAXI GEL POST OP (GAUZE/BANDAGES/DRESSINGS) ×3 IMPLANT

## 2014-07-02 NOTE — Op Note (Signed)
NAME:  Teresa Coffey                      MEDICAL RECORD NO.:  [id number]                             FACILITY:  [facility]      PHYSICIAN:  Madlyn Frankel. Charlann Boxer, M.D.  DATE OF BIRTH:  01/07/1941      DATE OF PROCEDURE:  07/02/2014                                     OPERATIVE REPORT         PREOPERATIVE DIAGNOSIS:  Left knee osteoarthritis.      POSTOPERATIVE DIAGNOSIS:  Left knee osteoarthritis.      FINDINGS:  The patient was noted to have complete loss of cartilage and   bone-on-bone arthritis with associated osteophytes in the lateral and patellofemoral compartments of   the knee with a significant synovitis and associated effusion.      PROCEDURE:  Left total knee replacement.      COMPONENTS USED:  DePuy rotating platform posterior stabilized knee   system, a size 2.5 femur, 2.5 tibia, 10 mm PS insert, and 38 patellar   button.      SURGEON:  Madlyn Frankel. Charlann Boxer, M.D.      ASSISTANT:  Skip Mayer, PA-C.      ANESTHESIA:  Spinal.      SPECIMENS:  None.      COMPLICATION:  None.      DRAINS:  None.  EBL: <50cc      TOURNIQUET TIME:   Total Tourniquet Time Documented: Thigh (Left) - 49 minutes Total: Thigh (Left) - 49 minutes  .      The patient was stable to the recovery room.      INDICATION FOR PROCEDURE:  Teresa Coffey is a 73 y.o. female patient of   mine.  The patient had been seen, evaluated, and treated conservatively in the   office with medication, activity modification, and injections.  The patient had   radiographic changes of bone-on-bone arthritis with endplate sclerosis and osteophytes noted.      The patient failed conservative measures including medication, injections, and activity modification, and at this point was ready for more definitive measures.   Based on the radiographic changes and failed conservative measures, the patient   decided to proceed with total knee replacement.  Risks of infection,   DVT, component failure, need for revision surgery,  postop course, and   expectations were all   discussed and reviewed.  Consent was obtained for benefit of pain   relief.      PROCEDURE IN DETAIL:  The patient was brought to the operative theater.   Once adequate anesthesia, preoperative antibiotics, 2 gm of Ancef administered, the patient was positioned supine with the left thigh tourniquet placed.  The  left lower extremity was prepped and draped in sterile fashion.  A time-   out was performed identifying the patient, planned procedure, and   extremity.      The left lower extremity was placed in the [facility] leg holder.  The leg was   exsanguinated, tourniquet elevated to 250 mmHg.  A midline incision was   made followed by median parapatellar arthrotomy.  Following initial   exposure, attention was first directed to  the patella.  Precut   measurement was noted to be 26 mm.  I resected down to 14-15 mm and used a   38 patellar button to restore patellar height as well as cover the cut   surface.      The lug holes were drilled and a metal shim was placed to protect the   patella from retractors and saw blades.      At this point, attention was now directed to the femur.  The femoral   canal was opened with a drill, irrigated to try to prevent fat emboli.  An   intramedullary rod was passed at 5 degrees valgus, 13 mm of bone was   resected off the distal femur due to flexion contracture pre-operatively.  Following this resection, the tibia was   subluxated anteriorly.  Using the extramedullary guide, 6 mm of bone was resected off   the proximal lateral tibia.  We confirmed the gap would be   stable medially and laterally with a 10 mm insert as well as confirmed   the cut was perpendicular in the coronal plane, checking with an alignment rod.      Once this was done, I sized the femur to be a size 2.5 in the anterior-   posterior dimension, chose a standard component based on medial and   lateral dimension.  The size 2.5 rotation  block was then pinned in   position anterior referenced using the C-clamp to set rotation.  The   anterior, posterior, and  chamfer cuts were made without difficulty nor   notching making certain that I was along the anterior cortex to help   with flexion gap stability.      The final box cut was made off the lateral aspect of distal femur.      At this point, the tibia was sized to be a size 2.5, the size 2.5 tray was   then pinned in position through the medial third of the tubercle,   drilled, and keel punched.  Trial reduction was now carried with a 2.5 femur,  2.5 tibia, a 10 mm insert, and the 38 patella botton.  The knee was brought to   extension, full extension with good flexion stability with the patella   tracking through the trochlea without application of pressure.  Given   all these findings, the trial components removed.  Final components were   opened and cement was mixed.  The knee was irrigated with normal saline   solution and pulse lavage.  The synovial lining was   then injected with 20cc of Exparel, 30cc of 0.25% Marcaine with epinephrine and 1 cc of Toradol,   total of 61 cc.      The knee was irrigated.  Final implants were then cemented onto clean and   dried cut surfaces of bone with the knee brought to extension with a 10 mm trial insert.      Once the cement had fully cured, the excess cement was removed   throughout the knee.  I confirmed I was satisfied with the range of   motion and stability, and the final 10 mm PS insert was chosen.  It was   placed into the knee.      The tourniquet had been let down at 49 minutes.  No significant   hemostasis required.  The   extensor mechanism was then reapproximated using #1 Vicryl with the knee   in flexion.  The  remaining wound was closed with 2-0 Vicryl and running 4-0 Monocryl.   The knee was cleaned, dried, dressed sterilely using Dermabond and   Aquacel dressing.  The patient was then   brought to  recovery room in stable condition, tolerating the procedure   well.   Please note that Physician Assistant, Skip MayerBlair Roberts, was present for the entirety of the case, and was utilized for pre-operative positioning, peri-operative retractor management, general facilitation of the procedure.  He was also utilized for primary wound closure at the end of the case.              Madlyn FrankelMatthew D. Charlann Boxerlin, M.D.    07/02/2014 3:56 PM

## 2014-07-02 NOTE — Anesthesia Preprocedure Evaluation (Addendum)
Anesthesia Evaluation  Patient identified by MRN, date of birth, ID band Patient awake  General Assessment Comment:  Other nonspecific abnormal serum enzyme levels     .  Anxiety state, unspecified     .  Pure hypercholesterolemia     .  Mitral valve disorders     .  Personal history of noncompliance with medical treatment, presenting hazards to health     .  Encounter for long-term (current) use of aspirin     .  Murmur, cardiac         Mild LVOT obstruction   .  Essential hypertension, malignant     .  Peripheral vascular disease     .  Shortness of breath         with exertion    .  Type II or unspecified type diabetes mellitus without mention of complication, not stated as uncontrolled         not on meds per patient   .  GERD (gastroesophageal reflux disease)     .  Headache(784.0)     .  Arthritis       Reviewed: Allergy & Precautions, H&P , NPO status , Patient's Chart, lab work & pertinent test results  Airway Mallampati: II TM Distance: >3 FB Neck ROM: Full    Dental no notable dental hx.    Pulmonary shortness of breath and with exertion,  breath sounds clear to auscultation  Pulmonary exam normal       Cardiovascular hypertension, Pt. on medications and Pt. on home beta blockers + Past MI, + Peripheral Vascular Disease and +CHF + Valvular Problems/Murmurs Rhythm:Regular Rate:Normal  H/O Non stemi MI 2013.  Cardiology visit February 2013 reviewed. EF normal.  Normal cath. By report.   Neuro/Psych  Headaches, PSYCHIATRIC DISORDERS Anxiety    GI/Hepatic Neg liver ROS, GERD-  Medicated,  Endo/Other  diabetes, Type 2, Oral Hypoglycemic Agents  Renal/GU CRF and Renal InsufficiencyRenal disease     Musculoskeletal negative musculoskeletal ROS (+)   Abdominal   Peds  Hematology negative hematology ROS (+)   Anesthesia Other Findings   Reproductive/Obstetrics negative OB ROS                       Anesthesia Physical Anesthesia Plan  ASA: III  Anesthesia Plan: Spinal   Post-op Pain Management:    Induction: Intravenous  Airway Management Planned:   Additional Equipment:   Intra-op Plan:   Post-operative Plan: Extubation in OR  Informed Consent: I have reviewed the patients History and Physical, chart, labs and discussed the procedure including the risks, benefits and alternatives for the proposed anesthesia with the patient or authorized representative who has indicated his/her understanding and acceptance.   Dental advisory given  Plan Discussed with: CRNA  Anesthesia Plan Comments: (Discussed general and spinal.  Discussed risks/benefits of spinal including headache, backache, failure, bleeding, infection, and nerve damage. Patient consents to spinal. Questions answered. Coagulation studies and platelet count acceptable.  Mild lvot obstruction. Neo drip ready.)     Anesthesia Quick Evaluation

## 2014-07-02 NOTE — Progress Notes (Signed)
07/02/14 2225 Nursing Brad Dixon PA called reg Patients high blood pressure at this time of 199/72 and 208/65 at 2130. Patient given labetolol and Catapres 0.2 mg at 2015 with nor results. Order received for clonidine 0.1 mg now. Will continue to monitor patient

## 2014-07-02 NOTE — Progress Notes (Signed)
Utilization review completed.  

## 2014-07-02 NOTE — Interval H&P Note (Signed)
History and Physical Interval Note:  07/02/2014 11:41 AM  Teresa EllisMildred Rego  has presented today for surgery, with the diagnosis of left knee osteoarthritis  The various methods of treatment have been discussed with the patient and family. After consideration of risks, benefits and other options for treatment, the patient has consented to  Procedure(s): LEFT TOTAL KNEE ARTHROPLASTY (Left) as a surgical intervention .  The patient's history has been reviewed, patient examined, no change in status, stable for surgery.  I have reviewed the patient's chart and labs.  Questions were answered to the patient's satisfaction.     Shelda PalLIN,Tkeya Stencil D

## 2014-07-02 NOTE — Anesthesia Procedure Notes (Signed)
Spinal  Staffing Anesthesiologist: Salley Scarlet CRNA/Resident: Kamora Vossler L Performed by: anesthesiologist and resident/CRNA  Preanesthetic Checklist Completed: patient identified, site marked, surgical consent, pre-op evaluation, timeout performed, IV checked, risks and benefits discussed and monitors and equipment checked Spinal Block Patient position: sitting Prep: Betadine Patient monitoring: heart rate, continuous pulse ox and blood pressure Approach: left paramedian Location: L3-4 Injection technique: single-shot Needle Needle gauge: 22 G Needle length: 5 cm Additional Notes Kit expiration date checked, Clear CSF, negative for heme, negative parasthesia.  Tolerated well and placed supine

## 2014-07-02 NOTE — Transfer of Care (Signed)
Immediate Anesthesia Transfer of Care Note  Patient: Teresa EllisMildred Coffey  Procedure(s) Performed: Procedure(s): LEFT TOTAL KNEE ARTHROPLASTY (Left)  Patient Location: PACU  Anesthesia Type:Spinal  Level of Consciousness: awake, alert  and oriented  Airway & Oxygen Therapy: Patient Spontanous Breathing and Patient connected to nasal cannula oxygen  Post-op Assessment: Report given to PACU RN and Post -op Vital signs reviewed and stable  Post vital signs: Reviewed and stable  Complications: No apparent anesthesia complications

## 2014-07-03 ENCOUNTER — Encounter (HOSPITAL_COMMUNITY): Payer: Self-pay | Admitting: Orthopedic Surgery

## 2014-07-03 DIAGNOSIS — E663 Overweight: Secondary | ICD-10-CM | POA: Diagnosis present

## 2014-07-03 DIAGNOSIS — D62 Acute posthemorrhagic anemia: Secondary | ICD-10-CM | POA: Diagnosis not present

## 2014-07-03 LAB — CBC
HCT: 33.5 % — ABNORMAL LOW (ref 36.0–46.0)
HEMOGLOBIN: 11.7 g/dL — AB (ref 12.0–15.0)
MCH: 31.6 pg (ref 26.0–34.0)
MCHC: 34.9 g/dL (ref 30.0–36.0)
MCV: 90.5 fL (ref 78.0–100.0)
PLATELETS: 229 10*3/uL (ref 150–400)
RBC: 3.7 MIL/uL — AB (ref 3.87–5.11)
RDW: 12.8 % (ref 11.5–15.5)
WBC: 9.8 10*3/uL (ref 4.0–10.5)

## 2014-07-03 LAB — GLUCOSE, CAPILLARY
GLUCOSE-CAPILLARY: 124 mg/dL — AB (ref 70–99)
Glucose-Capillary: 121 mg/dL — ABNORMAL HIGH (ref 70–99)
Glucose-Capillary: 133 mg/dL — ABNORMAL HIGH (ref 70–99)
Glucose-Capillary: 100 mg/dL — ABNORMAL HIGH (ref 70–99)

## 2014-07-03 LAB — BASIC METABOLIC PANEL
ANION GAP: 11 (ref 5–15)
BUN: 14 mg/dL (ref 6–23)
CALCIUM: 9 mg/dL (ref 8.4–10.5)
CO2: 25 mEq/L (ref 19–32)
CREATININE: 0.91 mg/dL (ref 0.50–1.10)
Chloride: 103 mEq/L (ref 96–112)
GFR, EST AFRICAN AMERICAN: 70 mL/min — AB (ref >90)
GFR, EST NON AFRICAN AMERICAN: 61 mL/min — AB (ref >90)
Glucose, Bld: 126 mg/dL — ABNORMAL HIGH (ref 70–99)
Potassium: 4.1 mEq/L (ref 3.7–5.3)
Sodium: 139 mEq/L (ref 137–147)

## 2014-07-03 MED ORDER — TIZANIDINE HCL 2 MG PO TABS: 2.0000 mg | ORAL_TABLET | Freq: Three times a day (TID) | ORAL | Status: AC | PRN

## 2014-07-03 MED ORDER — POLYETHYLENE GLYCOL 3350 17 G PO PACK: 17.0000 g | PACK | Freq: Two times a day (BID) | ORAL | Status: AC

## 2014-07-03 MED ORDER — DSS 100 MG PO CAPS: 100.0000 mg | ORAL_CAPSULE | Freq: Two times a day (BID) | ORAL | Status: AC

## 2014-07-03 MED ORDER — ASPIRIN 325 MG PO TBEC: 325.0000 mg | DELAYED_RELEASE_TABLET | Freq: Two times a day (BID) | ORAL | Status: AC

## 2014-07-03 MED ORDER — HYDROCODONE-ACETAMINOPHEN 7.5-325 MG PO TABS: 1.0000 | ORAL_TABLET | ORAL | Status: AC | PRN

## 2014-07-03 MED ORDER — FERROUS SULFATE 325 (65 FE) MG PO TABS: 325.0000 mg | ORAL_TABLET | Freq: Three times a day (TID) | ORAL | Status: AC

## 2014-07-03 NOTE — Progress Notes (Signed)
Physical Therapy Treatment Patient Details Name: Teresa EllisMildred Coffey MRN: [id number] DOB: 05/12/1941 Today's Date: 07/03/2014    History of Present Illness      PT Comments    Increased tolerance for OOB activity but continues ltd by c/o dizziness and nausea  Follow Up Recommendations  Home health PT     Equipment Recommendations  Rolling walker with 5" wheels    Recommendations for Other Services OT consult     Precautions / Restrictions Precautions Precautions: Fall;Knee Precaution Comments: Dizzy with attempts to stand Restrictions Weight Bearing Restrictions: No Other Position/Activity Restrictions: WBAT    Mobility  Bed Mobility Overal bed mobility: Needs Assistance Bed Mobility: Sit to Supine       Sit to supine: Min assist;Mod assist   General bed mobility comments: cues for sequence and use of R LE to self assist  Transfers Overall transfer level: Needs assistance Equipment used: Rolling walker (2 wheeled) Transfers: Sit to/from Stand Sit to Stand: Mod assist         General transfer comment: cues for LE management and use of UEs to self assist.  +2 assist 2* pts balance loss backward with initial standing  Ambulation/Gait Ambulation/Gait assistance: Min assist;Mod assist Ambulation Distance (Feet): 6 Feet Assistive device: Rolling walker (2 wheeled) Gait Pattern/deviations: Step-to pattern;Decreased step length - right;Decreased step length - left;Shuffle;Trunk flexed Gait velocity: decr   General Gait Details: Step-by-step cues for sequence, UE WB, posture and position from [employer]W   Stairs            Wheelchair Mobility    Modified Rankin (Stroke Patients Only)       Balance                                    Cognition Arousal/Alertness: Awake/alert Behavior During Therapy: WFL for tasks assessed/performed Overall Cognitive Status: Within Functional Limits for tasks assessed                      Exercises Total  Joint Exercises Ankle Circles/Pumps: AROM;Both;15 reps;Supine Quad Sets: AROM;Both;10 reps;Supine Heel Slides: AAROM;Left;15 reps;Supine Hip ABduction/ADduction: AAROM;Left;15 reps;Supine    General Comments        Pertinent Vitals/Pain     Home Living Family/patient expects to be discharged to:: Private residence Living Arrangements: Children Available Help at Discharge: Family Type of Home: House Home Access: Level entry   Home Layout: One level Home Equipment: Cane - quad      Prior Function Level of Independence: Independent with assistive device(s)          PT Goals (current goals can now be found in the care plan section) Acute Rehab PT Goals Patient Stated Goal: Walk with less pain PT Goal Formulation: With patient Time For Goal Achievement: 07/10/14 Potential to Achieve Goals: Good Progress towards PT goals: Progressing toward goals    Frequency  7X/week    PT Plan Current plan remains appropriate    Co-evaluation             End of Session Equipment Utilized During Treatment: Gait belt Activity Tolerance: Patient tolerated treatment well;Patient limited by fatigue;Other (comment) Patient left: in bed;with call bell/phone within reach     Time: 1245-1300 PT Time Calculation (min): 15 min  Charges:  $Gait Training: 8-22 mins $Therapeutic Exercise: 8-22 mins $Therapeutic Activity: 8-22 mins  G Codes:      Maahir Horst 07/03/2014, 1:05 PM

## 2014-07-03 NOTE — Care Management Note (Unsigned)
    Page 1 of 2   07/03/2014     11:42:20 AM CARE MANAGEMENT NOTE 07/03/2014  Patient:  Teresa Coffey   Account Number:  [id number]  Date Initiated:  07/03/2014  Documentation initiated by:  [facility]  Subjective/Objective Assessment:   73 year old female admitted s/p Left total knee replacement.     Action/Plan:   From home, needs HHPT. Stated her son will be caregiver.   Anticipated DC Date:  07/03/2014   Anticipated DC Plan:  [place]  CM consult      Choice offered to / List presented to:  C-1 Patient   DME arranged  [place]      DME agency  [place] arranged  [place]   Status of service:  Completed, signed off Medicare Important Message given?   (If response is "NO", the following Medicare IM given date fields will be blank) Date Medicare IM given:   Medicare IM given by:   Date Additional Medicare IM given:   Additional Medicare IM given by:    Discharge Disposition:  [place]  Per UR Regulation:  Reviewed for med. necessity/level of care/duration of stay  If discussed at [place] of Stay Meetings, dates discussed:    Comments:  07/03/14 Allene Dillon RN BSN [phone] Met with pt to discuss HHPT services. She did not have a preference on [facility] agency. Called several agencies and [facility] can see her on 7/10. Amy RN made aware and she will inform PT and attending to make sure it is ok for pt to be seen on 7/10 if she goes home today.

## 2014-07-03 NOTE — Progress Notes (Signed)
07/03/14 0215 Nursing Brad Dixon called reg bp 194/72 after clonidine doseage. No further orders received.

## 2014-07-03 NOTE — Progress Notes (Signed)
   Subjective: 1 Day Post-Op Procedure(s) (LRB): LEFT TOTAL KNEE ARTHROPLASTY (Left)   Patient reports pain as mild, pain controlled. No events throughout the night. Feels ready to work with PT. Ready to be discharged home if she does well with PT and pain stays controlled.   Objective:   VITALS:   Filed Vitals:   07/03/14  BP: 132/79  Pulse: 71  Temp: 99.1 F (37.3 C)   Resp: 16    Dorsiflexion/Plantar flexion intact Incision: dressing C/D/I No cellulitis present Compartment soft  LABS  Recent Labs  07/03/14 0430  HGB 11.7*  HCT 33.5*  WBC 9.8  PLT 229     Recent Labs  07/03/14 0626  NA 139  K 4.1  BUN 14  CREATININE 0.91  GLUCOSE 126*     Assessment/Plan: 1 Day Post-Op Procedure(s) (LRB): LEFT TOTAL KNEE ARTHROPLASTY (Left) Foley cath d/c'ed Advance diet Up with therapy D/C IV fluids Discharge home with home health [facility]. Follow up with OLIN,Syanne Looney D in 2 weeks.  Contact information:  [facility] [address], [address] [place] [id number] [phone]    Expected ABLA  Treated with iron and will observe  Overweight (BMI 25-29.9) Estimated body mass index is 27.4 kg/(m^2) as calculated from the following:   Height as of this encounter: 5\' 7"  (1.702 m).   Weight as of this encounter: 79.379 kg (175 lb). Patient also counseled that weight may inhibit the healing process Patient counseled that losing weight will help with future health issues         Anastasio AuerbachMatthew S. Cheyenne Bordeaux   PAC  07/03/2014, 9:31 AM

## 2014-07-03 NOTE — Anesthesia Postprocedure Evaluation (Signed)
  Anesthesia Post-op Note  Patient: Teresa EllisMildred Coffey  Procedure(s) Performed: Procedure(s) (LRB): LEFT TOTAL KNEE ARTHROPLASTY (Left)  Patient Location: PACU  Anesthesia Type: Spinal  Level of Consciousness: awake and alert   Airway and Oxygen Therapy: Patient Spontanous Breathing  Post-op Pain: mild  Post-op Assessment: Post-op Vital signs reviewed, Patient's Cardiovascular Status Stable, Respiratory Function Stable, Patent Airway and No signs of Nausea or vomiting  Last Vitals:  Filed Vitals:   07/03/14 0533  BP: 187/73  Pulse: 80  Temp: 36.9 C  Resp: 18    Post-op Vital Signs: stable   Complications: No apparent anesthesia complications

## 2014-07-03 NOTE — Evaluation (Signed)
Physical Therapy Evaluation Patient Details Name: Teresa EllisMildred Coffey MRN: [id number] DOB: 03/26/1941 Today's Date: 07/03/2014   History of Present Illness     Clinical Impression  Pt s/p L TKR presents with decreased L LE strength/ROM, post op pain, and dizziness with standing limiting functional mobility.  Pt should progress to d/c home with family assist and HHPT follow up    Follow Up Recommendations Home health PT    Equipment Recommendations  Rolling walker with 5" wheels    Recommendations for Other Services OT consult     Precautions / Restrictions Precautions Precautions: Fall;Knee Precaution Comments: Dizzy with attempts to stand Restrictions Weight Bearing Restrictions: No Other Position/Activity Restrictions: WBAT      Mobility  Bed Mobility Overal bed mobility:  (NT - Pt up in chair)                Transfers Overall transfer level: Needs assistance Equipment used: Rolling walker (2 wheeled) Transfers: Sit to/from Stand Sit to Stand: +2 physical assistance;Mod assist         General transfer comment: cues for LE management and use of UEs to self assist.  +2 assist 2* pts balance loss backward with initial standing  Ambulation/Gait Ambulation/Gait assistance:  (NT - pt severely dizzy on standing)              Stairs            Wheelchair Mobility    Modified Rankin (Stroke Patients Only)       Balance                                             Pertinent Vitals/Pain 8/10; premed, ice pack provided    Home Living Family/patient expects to be discharged to:: Private residence Living Arrangements: Children Available Help at Discharge: Family Type of Home: House Home Access: Level entry     Home Layout: One level Home Equipment: Medical laboratory scientific officerCane - quad      Prior Function Level of Independence: Independent with assistive device(s)               Hand Dominance        Extremity/Trunk Assessment   Upper  Extremity Assessment: Overall WFL for tasks assessed           Lower Extremity Assessment: LLE deficits/detail   LLE Deficits / Details: 3-/5 quads with AAROM at knee -10 - 70     Communication   Communication: No difficulties  Cognition Arousal/Alertness: Awake/alert Behavior During Therapy: WFL for tasks assessed/performed Overall Cognitive Status: Within Functional Limits for tasks assessed                      General Comments      Exercises Total Joint Exercises Ankle Circles/Pumps: AROM;Both;15 reps;Supine Quad Sets: AROM;Both;10 reps;Supine Heel Slides: AAROM;Left;15 reps;Supine Hip ABduction/ADduction: AAROM;Left;15 reps;Supine      Assessment/Plan    PT Assessment Patient needs continued PT services  PT Diagnosis Difficulty walking   PT Problem List Decreased strength;Decreased range of motion;Decreased activity tolerance;Decreased mobility;Decreased balance;Decreased knowledge of use of DME;Obesity;Pain  PT Treatment Interventions DME instruction;Gait training;Functional mobility training;Therapeutic activities;Therapeutic exercise;Patient/family education   PT Goals (Current goals can be found in the Care Plan section) Acute Rehab PT Goals Patient Stated Goal: Walk with less pain PT Goal Formulation: With patient Time For Goal Achievement: 07/10/14 Potential  to Achieve Goals: Good    Frequency 7X/week   Barriers to discharge        Co-evaluation               End of Session Equipment Utilized During Treatment: Gait belt Activity Tolerance: Patient tolerated treatment well;Other (comment);Patient limited by pain Patient left: in chair;with call bell/phone within reach Nurse Communication: Mobility status         Time: [id number] PT Time Calculation (min): 31 min   Charges:   PT Evaluation $Initial PT Evaluation Tier I: 1 Procedure PT Treatments $Therapeutic Exercise: 8-22 mins $Therapeutic Activity: 8-22 mins   PT G Codes:           Ranjit Ashurst 07/03/2014, 10:59 AM

## 2014-07-03 NOTE — Progress Notes (Signed)
OT Cancellation Note  Patient Details Name: Oretha EllisMildred Siefring MRN: [id number] DOB: 11/21/1941   Cancelled Treatment:     Pt limited by dizziness with PT. Will likely check back tomorrow.  Amily Depp 07/03/2014, 9:52 AM Marica OtterMaryellen Robbye Dede, OTR/L [phone] 07/03/2014

## 2014-07-03 NOTE — Progress Notes (Signed)
Pt reports she does not take Lotemax opthalmic drops. Only takes Restasis.

## 2014-07-04 LAB — BASIC METABOLIC PANEL
Anion gap: 11 (ref 5–15)
BUN: 25 mg/dL — AB (ref 6–23)
CALCIUM: 8.9 mg/dL (ref 8.4–10.5)
CHLORIDE: 102 meq/L (ref 96–112)
CO2: 25 meq/L (ref 19–32)
CREATININE: 1.36 mg/dL — AB (ref 0.50–1.10)
GFR calc Af Amer: 43 mL/min — ABNORMAL LOW (ref >90)
GFR calc non Af Amer: 37 mL/min — ABNORMAL LOW (ref >90)
GLUCOSE: 106 mg/dL — AB (ref 70–99)
Potassium: 4.5 mEq/L (ref 3.7–5.3)
Sodium: 138 mEq/L (ref 137–147)

## 2014-07-04 LAB — CBC
HEMATOCRIT: 27.5 % — AB (ref 36.0–46.0)
HEMOGLOBIN: 9.4 g/dL — AB (ref 12.0–15.0)
MCH: 31.4 pg (ref 26.0–34.0)
MCHC: 34.2 g/dL (ref 30.0–36.0)
MCV: 92 fL (ref 78.0–100.0)
Platelets: 183 10*3/uL (ref 150–400)
RBC: 2.99 MIL/uL — AB (ref 3.87–5.11)
RDW: 12.9 % (ref 11.5–15.5)
WBC: 7.2 10*3/uL (ref 4.0–10.5)

## 2014-07-04 LAB — GLUCOSE, CAPILLARY
GLUCOSE-CAPILLARY: 104 mg/dL — AB (ref 70–99)
Glucose-Capillary: 107 mg/dL — ABNORMAL HIGH (ref 70–99)

## 2014-07-04 NOTE — Progress Notes (Signed)
Physical Therapy Treatment Patient Details Name: Teresa EllisMildred Coffey MRN: [id number] DOB: 04/17/1941 Today's Date: 07/04/2014    History of Present Illness pt is s/p L TKA    PT Comments    Increased activity tolerance noted but with pt demonstrating instability and intermittent poor safety awareness with mobility.  Follow Up Recommendations  Home health PT     Equipment Recommendations  Rolling walker with 5" wheels    Recommendations for Other Services OT consult     Precautions / Restrictions Precautions Precautions: Fall;Knee Restrictions Weight Bearing Restrictions: No Other Position/Activity Restrictions: WBAT    Mobility  Bed Mobility Overal bed mobility:  (NT - up with OT to chair)         Sit to supine: Modified independent (Device/Increase time)      Transfers Overall transfer level: Needs assistance Equipment used: Rolling walker (2 wheeled) Transfers: Sit to/from Stand Sit to Stand: Min assist         General transfer comment: Cues for LE management and use of UEs to self assist.  Min assist to bring wt fwd and correct for post drift with initial standing.  Ambulation/Gait Ambulation/Gait assistance: Min assist Ambulation Distance (Feet): 50 Feet Assistive device: Rolling walker (2 wheeled) Gait Pattern/deviations: Step-to pattern;Decreased step length - right;Decreased step length - left;Shuffle;Trunk flexed Gait velocity: decr   General Gait Details: Step-by-step cues for sequence, UE WB, posture and position from Rohm and HaasW   Stairs            Wheelchair Mobility    Modified Rankin (Stroke Patients Only)       Balance                                    Cognition Arousal/Alertness: Awake/alert Behavior During Therapy: WFL for tasks assessed/performed Overall Cognitive Status: Within Functional Limits for tasks assessed                      Exercises Total Joint Exercises Ankle Circles/Pumps: AROM;Both;15  reps;Supine Quad Sets: AROM;Both;Supine;20 reps Heel Slides: AAROM;Left;Supine;20 reps Straight Leg Raises: AROM;AAROM;20 reps;Supine;Left Goniometric ROM: AAROM at L knee -10 - 80    General Comments        Pertinent Vitals/Pain 4/10; premed, ice packs provided.    Home Living Family/patient expects to be discharged to:: Private residence Living Arrangements: Children Available Help at Discharge: Family Type of Home: House Home Access: Level entry   Home Layout: One level        Prior Function Level of Independence: Independent with assistive device(s)          PT Goals (current goals can now be found in the care plan section) Acute Rehab PT Goals Patient Stated Goal: Walk with less pain PT Goal Formulation: With patient Time For Goal Achievement: 07/10/14 Potential to Achieve Goals: Good Progress towards PT goals: Progressing toward goals    Frequency  7X/week    PT Plan Current plan remains appropriate    Co-evaluation             End of Session Equipment Utilized During Treatment: Gait belt Activity Tolerance: Patient tolerated treatment well Patient left: in chair;with call bell/phone within reach     Time: 0916-0944 PT Time Calculation (min): 28 min  Charges:  $Gait Training: 8-22 mins $Therapeutic Exercise: 8-22 mins  G Codes:      Bibi Economos Jul 12, 2014, 11:47 AM

## 2014-07-04 NOTE — Progress Notes (Signed)
Occupational Therapy Treatment Patient Details Name: Teresa Coffey MRN: [id number] DOB: [birth year] Today's Date: 07/04/2014    History of present illness pt is s/p L TKA   OT comments  Pt is making progress but needs repetition/reinforcement with safety education  Follow Up Recommendations  Home health OT    Equipment Recommendations  3 in 1 bedside comode    Recommendations for Other Services      Precautions / Restrictions Precautions Precautions: Fall;Knee Restrictions Weight Bearing Restrictions: No Other Position/Activity Restrictions: WBAT       Mobility Bed Mobility                 Transfers Overall transfer level: Needs assistance Equipment used: Rolling walker (2 wheeled) Transfers: Sit to/from Stand Sit to Stand: Min assist         General transfer comment: cues for UE/LE position and weight shifting forward as she leaned posteriorly with first sit to stand    Balance                                   ADL       Grooming: Wash/dry hands;Min guard;Standing                   Toilet Transfer: Min guard;Minimal assistance;Ambulation;BSC   Toileting- Architect and Hygiene: Min guard;Sit to/from stand         General ADL Comments: pt initially unsteady with first sit to stand.  States that she had difficulty with this prior to surgery.  Cues for safety for keeping one hand on walker, turning walker with her when going to sink, and waiting prior to any activity to make sure that she has her balance.  Also educated that she needs her son to be within arms reach for any standing/walking activity      Vision                     Perception     Praxis      Cognition   Behavior During Therapy: [facility] for tasks assessed/performed Overall Cognitive Status: Within Functional Limits for tasks assessed (decreased safety awareness; decreased memory--new learning)                       Extremity/Trunk  Assessment               Exercises    Shoulder Instructions       General Comments      Pertinent Vitals/ Pain       Feels better than this morning: a little bit of pins/needles.  Repositioned and ice removed  Home Living                                          Prior Functioning/Environment              Frequency       Progress Toward Goals  OT Goals(current goals can now be found in the care plan section)  Progress towards OT goals: Progressing toward goals  Acute Rehab OT Goals Patient Stated Goal: Walk with less pain  Plan      Co-evaluation                 End of Session  Activity Tolerance Patient tolerated treatment well   Patient Left in chair;with call bell/phone within reach   Nurse Communication          Time: [id number] OT Time Calculation (min): 17 min  Charges: OT General Charges $OT Visit: 1 Procedure OT Treatments $Self Care/Home Management : 8-22 mins  Teresa Coffey 07/04/2014, 12:45 PM  Teresa Coffey, OTR/L [phone] 07/04/2014

## 2014-07-04 NOTE — Progress Notes (Signed)
Patient ID: Oretha EllisMildred Assefa, female   DOB: 11/04/1941, 73 y.o.   MRN: [id number] Subjective: 2 Days Post-Op Procedure(s) (LRB): LEFT TOTAL KNEE ARTHROPLASTY (Left)    Patient reports pain as moderate.  Doing OK.  Planning to go home with her son  Objective:   VITALS:   Filed Vitals:   07/04/14 0548  BP: 164/77  Pulse: 66  Temp: 98.1 F (36.7 C)  Resp: 16    Neurovascular intact Incision: dressing C/D/I  LABS  Recent Labs  07/03/14 0430 07/04/14 0443  HGB 11.7* 9.4*  HCT 33.5* 27.5*  WBC 9.8 7.2  PLT 229 183     Recent Labs  07/03/14 0626 07/04/14 0443  NA 139 138  K 4.1 4.5  BUN 14 25*  CREATININE 0.91 1.36*  GLUCOSE 126* 106*    No results found for this basename: LABPT, INR,  in the last 72 hours   Assessment/Plan: 2 Days Post-Op Procedure(s) (LRB): LEFT TOTAL KNEE ARTHROPLASTY (Left)   Advance diet Up with therapy Discharge home with home health today after therapy

## 2014-07-04 NOTE — Progress Notes (Signed)
Physical Therapy Treatment Patient Details Name: Teresa EllisMildred Coffey MRN: [id number] DOB: 04/12/1941 Today's Date: 07/04/2014    History of Present Illness pt is s/p L TKA    PT Comments    Pt progressing with improvement in stability this pm  Follow Up Recommendations  Home health PT     Equipment Recommendations  Rolling walker with 5" wheels    Recommendations for Other Services OT consult     Precautions / Restrictions Precautions Precautions: Fall;Knee Restrictions Weight Bearing Restrictions: No Other Position/Activity Restrictions: WBAT    Mobility  Bed Mobility                  Transfers Overall transfer level: Needs assistance Equipment used: Rolling walker (2 wheeled) Transfers: Sit to/from Stand Sit to Stand: Supervision         General transfer comment: cues for LE position and use of UEs  Ambulation/Gait Ambulation/Gait assistance: Min guard;Supervision Ambulation Distance (Feet): 111 Feet Assistive device: Rolling walker (2 wheeled) Gait Pattern/deviations: Step-to pattern;Shuffle;Trunk flexed;Decreased step length - right;Decreased step length - left Gait velocity: decr   General Gait Details: cues for posture and position from Rohm and HaasW   Stairs            Wheelchair Mobility    Modified Rankin (Stroke Patients Only)       Balance                                    Cognition Arousal/Alertness: Awake/alert Behavior During Therapy: WFL for tasks assessed/performed Overall Cognitive Status: Within Functional Limits for tasks assessed                      Exercises      General Comments        Pertinent Vitals/Pain 4/10; premed, ice packs provided    Home Living                      Prior Function            PT Goals (current goals can now be found in the care plan section) Acute Rehab PT Goals Patient Stated Goal: Walk with less pain PT Goal Formulation: With patient Time For Goal  Achievement: 07/10/14 Potential to Achieve Goals: Good Progress towards PT goals: Progressing toward goals    Frequency  7X/week    PT Plan Current plan remains appropriate    Co-evaluation             End of Session   Activity Tolerance: Patient tolerated treatment well Patient left: in chair;with call bell/phone within reach     Time: 1325-1343 PT Time Calculation (min): 18 min  Charges:  $Gait Training: 8-22 mins                    G Codes:      Tunis Gentle 07/04/2014, 3:17 PM

## 2014-07-04 NOTE — Evaluation (Signed)
Occupational Therapy Evaluation Patient Details Name: Teresa Coffey MRN: [id number] DOB: July 31, 1941 Today's Date: 07/04/2014    History of Present Illness pt is s/p L TKA   Clinical Impression   This 73 year old female was admitted for the above surgery.  She will benefit from skilled OT to increase safety and independence with adls.  Pt currently needs min to mod A for LB adls and toilet transfers and goals are set for min guard level in acute.  Pt was mod I prior to admission.    Follow Up Recommendations  Home health OT    Equipment Recommendations  3 in 1 bedside comode (delivered)    Recommendations for Other Services       Precautions / Restrictions Precautions Precautions: Fall;Knee Restrictions Other Position/Activity Restrictions: WBAT      Mobility Bed Mobility           Sit to supine: Modified independent (Device/Increase time)      Transfers       Sit to Stand: Min assist;From elevated surface         General transfer comment: cues for UE/LE position    Balance                                            ADL Overall ADL's : Needs assistance/impaired     Grooming: Wash/dry hands;Wash/dry face;Sitting   Upper Body Bathing: Set up;Sitting   Lower Body Bathing: Minimal assistance;Sit to/from stand   Upper Body Dressing : Set up;Sitting   Lower Body Dressing: Sit to/from stand;Moderate assistance   Toilet Transfer: Minimal assistance;Stand-pivot (to chair)   Toileting- Clothing Manipulation and Hygiene: Minimal assistance;Sit to/from stand         General ADL Comments: performed adl from eob then transferred to recliner.  Pt needed cues for safety:  has a tendency to lean posteriorly in standing:  min A given for balance.  Cues also given for sequence.  Recommended she bathe from commode in bathroom; she is used to sitting on edge of tub.  Also recommended son guard her with standing for adls.  Educated on reacher: pt  plans to get this     Vision                     Perception     Praxis      Pertinent Vitals/Pain States L knee feels numb, like pins and needles.  She states she reported this to MD.  Repositioned--had just removed ice     Hand Dominance     Extremity/Trunk Assessment Upper Extremity Assessment Upper Extremity Assessment: Overall WFL for tasks assessed           Communication Communication Communication: No difficulties   Cognition Arousal/Alertness: Awake/alert Behavior During Therapy: WFL for tasks assessed/performed Overall Cognitive Status: Within Functional Limits for tasks assessed                     General Comments       Exercises       Shoulder Instructions      Home Living Family/patient expects to be discharged to:: Private residence Living Arrangements: Children Available Help at Discharge: Family Type of Home: House Home Access: Level entry     Home Layout: One level     Bathroom Shower/Tub: Tub/shower unit Shower/tub characteristics: Engineer, building services: Standard  Prior Functioning/Environment Level of Independence: Independent with assistive device(s)             OT Diagnosis: Generalized weakness   OT Problem List: Pain;Impaired balance (sitting and/or standing);Decreased safety awareness;Decreased knowledge of use of DME or AE   OT Treatment/Interventions: Self-care/ADL training;DME and/or AE instruction;Balance training;Patient/family education    OT Goals(Current goals can be found in the care plan section) Acute Rehab OT Goals Patient Stated Goal: Walk with less pain OT Goal Formulation: With patient Time For Goal Achievement: 07/11/14 Potential to Achieve Goals: Good ADL Goals Pt Will Perform Grooming: with min guard assist;standing Pt Will Perform Lower Body Bathing: with min guard assist;with adaptive equipment;sit to/from stand Pt Will Perform Lower Body Dressing: with min  guard assist;with adaptive equipment;sit to/from stand (pants with reacher) Pt Will Transfer to Toilet: with min guard assist;bedside commode;ambulating  OT Frequency: Min 2X/week   Barriers to D/C:            Co-evaluation              End of Session    Activity Tolerance: Patient tolerated treatment well Patient left: in chair;with call bell/phone within reach   Time: 0757-0827 OT Time Calculation (min): 30 min Charges:  OT General Charges $OT Visit: 1 Procedure OT Evaluation $Initial OT Evaluation Tier I: 1 Procedure OT Treatments $Self Care/Home Management : 23-37 mins G-Codes:    Darnella Zeiter 07/04/2014, 8:39 AM  Marica OtterMaryellen Jamilynn Whitacre, OTR/L [phone] 07/04/2014

## 2014-07-09 NOTE — Discharge Summary (Signed)
Physician Discharge Summary  Patient ID: Teresa Coffey MRN: [id number] DOB/AGE: 74-05-1941 74 y.o.  Admit date: 07/02/2014 Discharge date: 07/04/2014   Procedures:  Procedure(s) (LRB): LEFT TOTAL KNEE ARTHROPLASTY (Left)  Attending Physician:  Dr. Durene Romans   Admission Diagnoses:   Left knee OA / pain  Discharge Diagnoses:  Principal Problem:   S/P left TKA Active Problems:   Overweight (BMI 25.0-29.9)   Postoperative anemia due to acute blood loss  Past Medical History  Diagnosis Date  . Other nonspecific abnormal serum enzyme levels   . Anxiety state, unspecified   . Pure hypercholesterolemia   . Mitral valve disorders   . Personal history of noncompliance with medical treatment, presenting hazards to health   . Encounter for long-term (current) use of aspirin   . Murmur, cardiac     Mild LVOT obstruction  . Essential hypertension, malignant   . Peripheral vascular disease   . Shortness of breath     with exertion   . Type II or unspecified type diabetes mellitus without mention of complication, not stated as uncontrolled     not on meds per patient  . GERD (gastroesophageal reflux disease)   . Headache(784.0)   . Arthritis   . Chronic kidney disease     CKD- stage III  . Chronic diastolic CHF (congestive heart failure)   . Bilateral carotid bruits   . Peripheral neuropathy   . Gout     HPI: Teresa Coffey, 73 y.o. female, has a history of pain and functional disability in the left knee due to trauma and arthritis and has failed non-surgical conservative treatments for greater than 12 weeks to include NSAID's and/or analgesics, corticosteriod injections, use of assistive devices and activity modification. Onset of symptoms was gradual, starting years ago with gradually worsening course since that time. The patient noted no past surgery on the left knee(s). Patient currently rates pain in the left knee(s) at 10 out of 10 with activity. Patient has night pain,  worsening of pain with activity and weight bearing, pain that interferes with activities of daily living, pain with passive range of motion, crepitus and joint swelling. Patient has evidence of periarticular osteophytes and joint space narrowing by imaging studies. There is no active infection. Risks, benefits and expectations were discussed with the patient. Risks including but not limited to the risk of anesthesia, blood clots, nerve damage, blood vessel damage, failure of the prosthesis, infection and up to and including death. Patient understand the risks, benefits and expectations and wishes to proceed with surgery.   PCP: HAMA AMIN, ALI M., MD   Discharged Condition: good  Hospital Course:  Patient underwent the above stated procedure on 07/02/2014. Patient tolerated the procedure well and brought to the recovery room in good condition and subsequently to the floor.  POD #1 BP: 132/79 ; Pulse: 71 ; Temp: 99.1 F (37.3 C) ; Resp: 16  Patient reports pain as mild, pain controlled. No events throughout the night. Feels ready to work with PT.  Dorsiflexion/plantar flexion intact, incision: dressing C/D/I, no cellulitis present and compartment soft.   LABS  Basename    HGB  11.7  HCT  33.5   POD #2  BP: 164/77 ; Pulse: 66 ; Temp: 98.1 F (36.7 C) ; Resp: 16 ;  Patient reports pain as moderate. Doing OK. Planning to go home with her son Neurovascular intact & incision: dressing C/D/I  LABS  Basename    HGB  9.4  HCT  27.5  Discharge Exam: General appearance: alert, cooperative and no distress Extremities: Homans sign is negative, no sign of DVT, no edema, redness or tenderness in the calves or thighs and no ulcers, gangrene or trophic changes  Disposition: Home with follow up in 2 weeks   Follow-up Information   Follow up with Shelda Pal, MD. Schedule an appointment as soon as possible for a visit in 2 weeks.   Specialty:  Orthopedic Surgery   Contact information:   [address] [place] [address] [phone]       Discharge Instructions   Call MD / Call 911    Complete by:  As directed   If you experience chest pain or shortness of breath, CALL 911 and be transported to the hospital emergency room.  If you develope a fever above 101 F, pus (Dougan drainage) or increased drainage or redness at the wound, or calf pain, call your surgeon's office.     Change dressing    Complete by:  As directed   Maintain surgical dressing for 10-14 days, or until follow up in the clinic.     Constipation Prevention    Complete by:  As directed   Drink plenty of fluids.  Prune juice may be helpful.  You may use a stool softener, such as Colace (over the counter) 100 mg twice a day.  Use MiraLax (over the counter) for constipation as needed.     Diet - low sodium heart healthy    Complete by:  As directed      Discharge instructions    Complete by:  As directed   Maintain surgical dressing for 10-14 days, or until follow up in the clinic. Follow up in 2 weeks at [facility]. Call with any questions or concerns.     Increase activity slowly as tolerated    Complete by:  As directed      TED hose    Complete by:  As directed   Use stockings (TED hose) for 2 weeks on both leg(s).  You may remove them at night for sleeping.     Weight bearing as tolerated    Complete by:  As directed   Laterality:  left  Extremity:  Lower             Medication List    STOP taking these medications       aspirin 81 MG tablet  Replaced by:  aspirin 325 MG EC tablet     traMADol 50 MG tablet  Commonly known as:  ULTRAM      TAKE these medications       allopurinol 100 MG tablet  Commonly known as:  ZYLOPRIM  Take 100 mg by mouth 2 (two) times daily.     ALPRAZolam 0.5 MG tablet  Commonly known as:  XANAX  Take 0.5 mg by mouth 3 (three) times daily as needed for anxiety.     amLODipine 10 MG tablet  Commonly known as:  NORVASC  Take  10 mg by mouth every morning.     aspirin 325 MG EC tablet  Take 1 tablet (325 mg total) by mouth 2 (two) times daily.     atorvastatin 20 MG tablet  Commonly known as:  LIPITOR  Take 1 tablet by mouth Daily.     chlorthalidone 25 MG tablet  Commonly known as:  HYGROTON  Take 1 tablet by mouth Daily.     cholecalciferol 1000 UNITS tablet  Commonly known as:  VITAMIN D  Take 1,000 Units by mouth daily.     cloNIDine 0.2 MG tablet  Commonly known as:  CATAPRES  Take 0.2 mg by mouth 2 (two) times daily.     cycloSPORINE 0.05 % ophthalmic emulsion  Commonly known as:  RESTASIS  Place 1 drop into both eyes 2 (two) times daily.     DSS 100 MG Caps  Take 100 mg by mouth 2 (two) times daily.     ferrous sulfate 325 (65 FE) MG tablet  Take 1 tablet (325 mg total) by mouth 3 (three) times daily after meals.     fluticasone 50 MCG/ACT nasal spray  Commonly known as:  FLONASE  Place 2 sprays into both nostrils daily.     gabapentin 300 MG capsule  Commonly known as:  NEURONTIN  Take 300 mg by mouth 3 (three) times daily.     HYDROcodone-acetaminophen 7.5-325 MG per tablet  Commonly known as:  NORCO  Take 1-2 tablets by mouth every 4 (four) hours as needed for moderate pain.     isosorbide mononitrate 30 MG 24 hr tablet  Commonly known as:  IMDUR  Take 30 mg by mouth every morning.     labetalol 200 MG tablet  Commonly known as:  NORMODYNE  Take 1 tablet by mouth Twice daily.     loteprednol 0.5 % ophthalmic suspension  Commonly known as:  LOTEMAX  Place 1 drop into both eyes 4 (four) times daily.     omeprazole 40 MG capsule  Commonly known as:  PRILOSEC  Take 40 mg by mouth daily.     polyethylene glycol packet  Commonly known as:  MIRALAX / GLYCOLAX  Take 17 g by mouth 2 (two) times daily.     potassium chloride SA 20 MEQ tablet  Commonly known as:  K-DUR,KLOR-CON  Take 20 mEq by mouth daily.     promethazine 25 MG tablet  Commonly known as:  PHENERGAN  Take  25 mg by mouth 3 (three) times daily as needed for nausea.     pyridoxine 100 MG tablet  Commonly known as:  B-6  Take 100 mg by mouth daily.     sucralfate 1 G tablet  Commonly known as:  CARAFATE  Take 1 g by mouth 4 (four) times daily -  with meals and at bedtime.     tiZANidine 2 MG tablet  Commonly known as:  ZANAFLEX  Take 1 tablet (2 mg total) by mouth every 8 (eight) hours as needed for muscle spasms.         Signed: Anastasio AuerbachMatthew S. Zayleigh Stroh   PA-C  07/09/2014, 5:38 PM

## 2016-01-30 IMAGING — CR DG CHEST 2V
2 series · 2 of 2 positions shown · non-contrast
Comparison: 01/28/2012

CLINICAL DATA: Preop left knee.  History of hypertension.

EXAM:
CHEST  2 VIEW

[w chest pa]
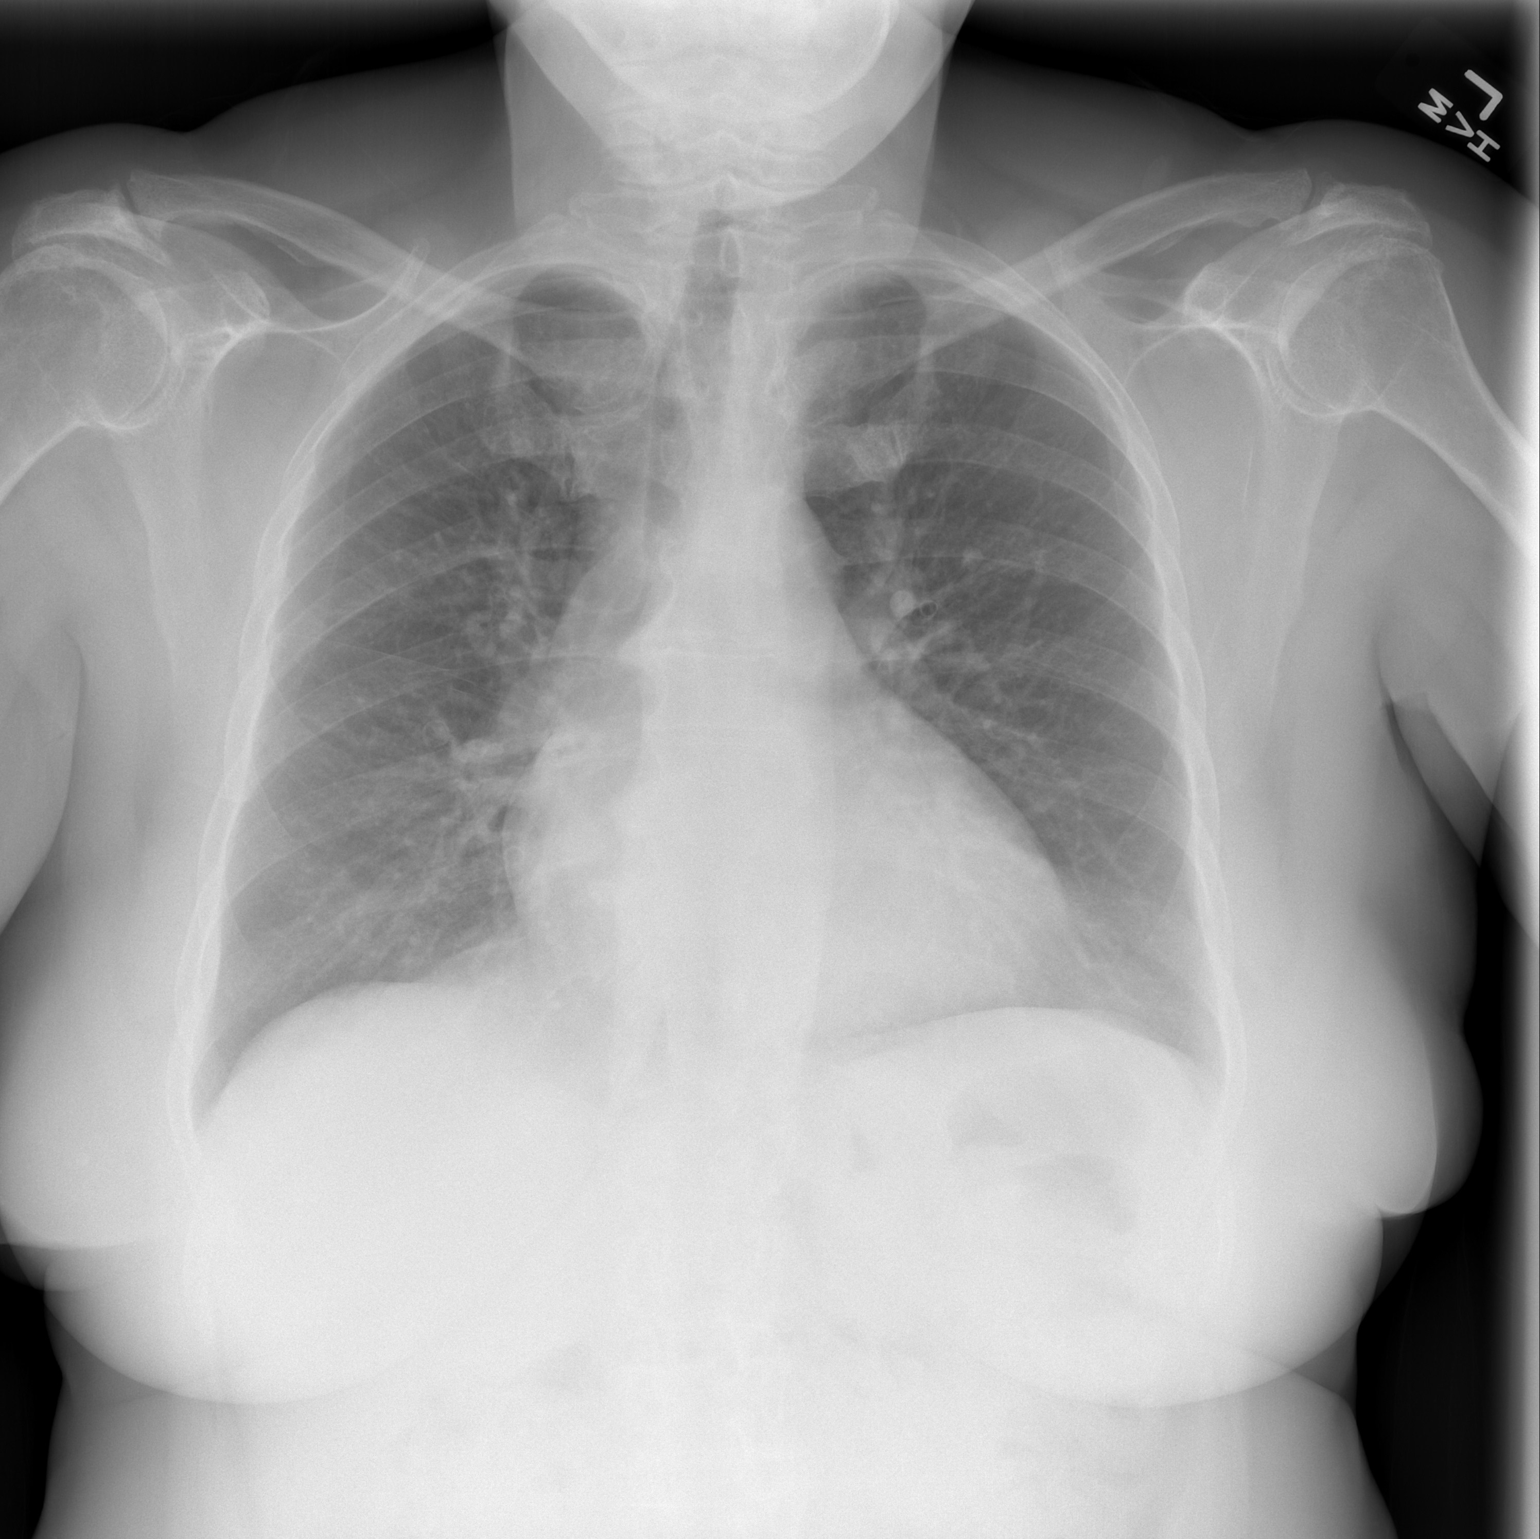

[w chest lat]
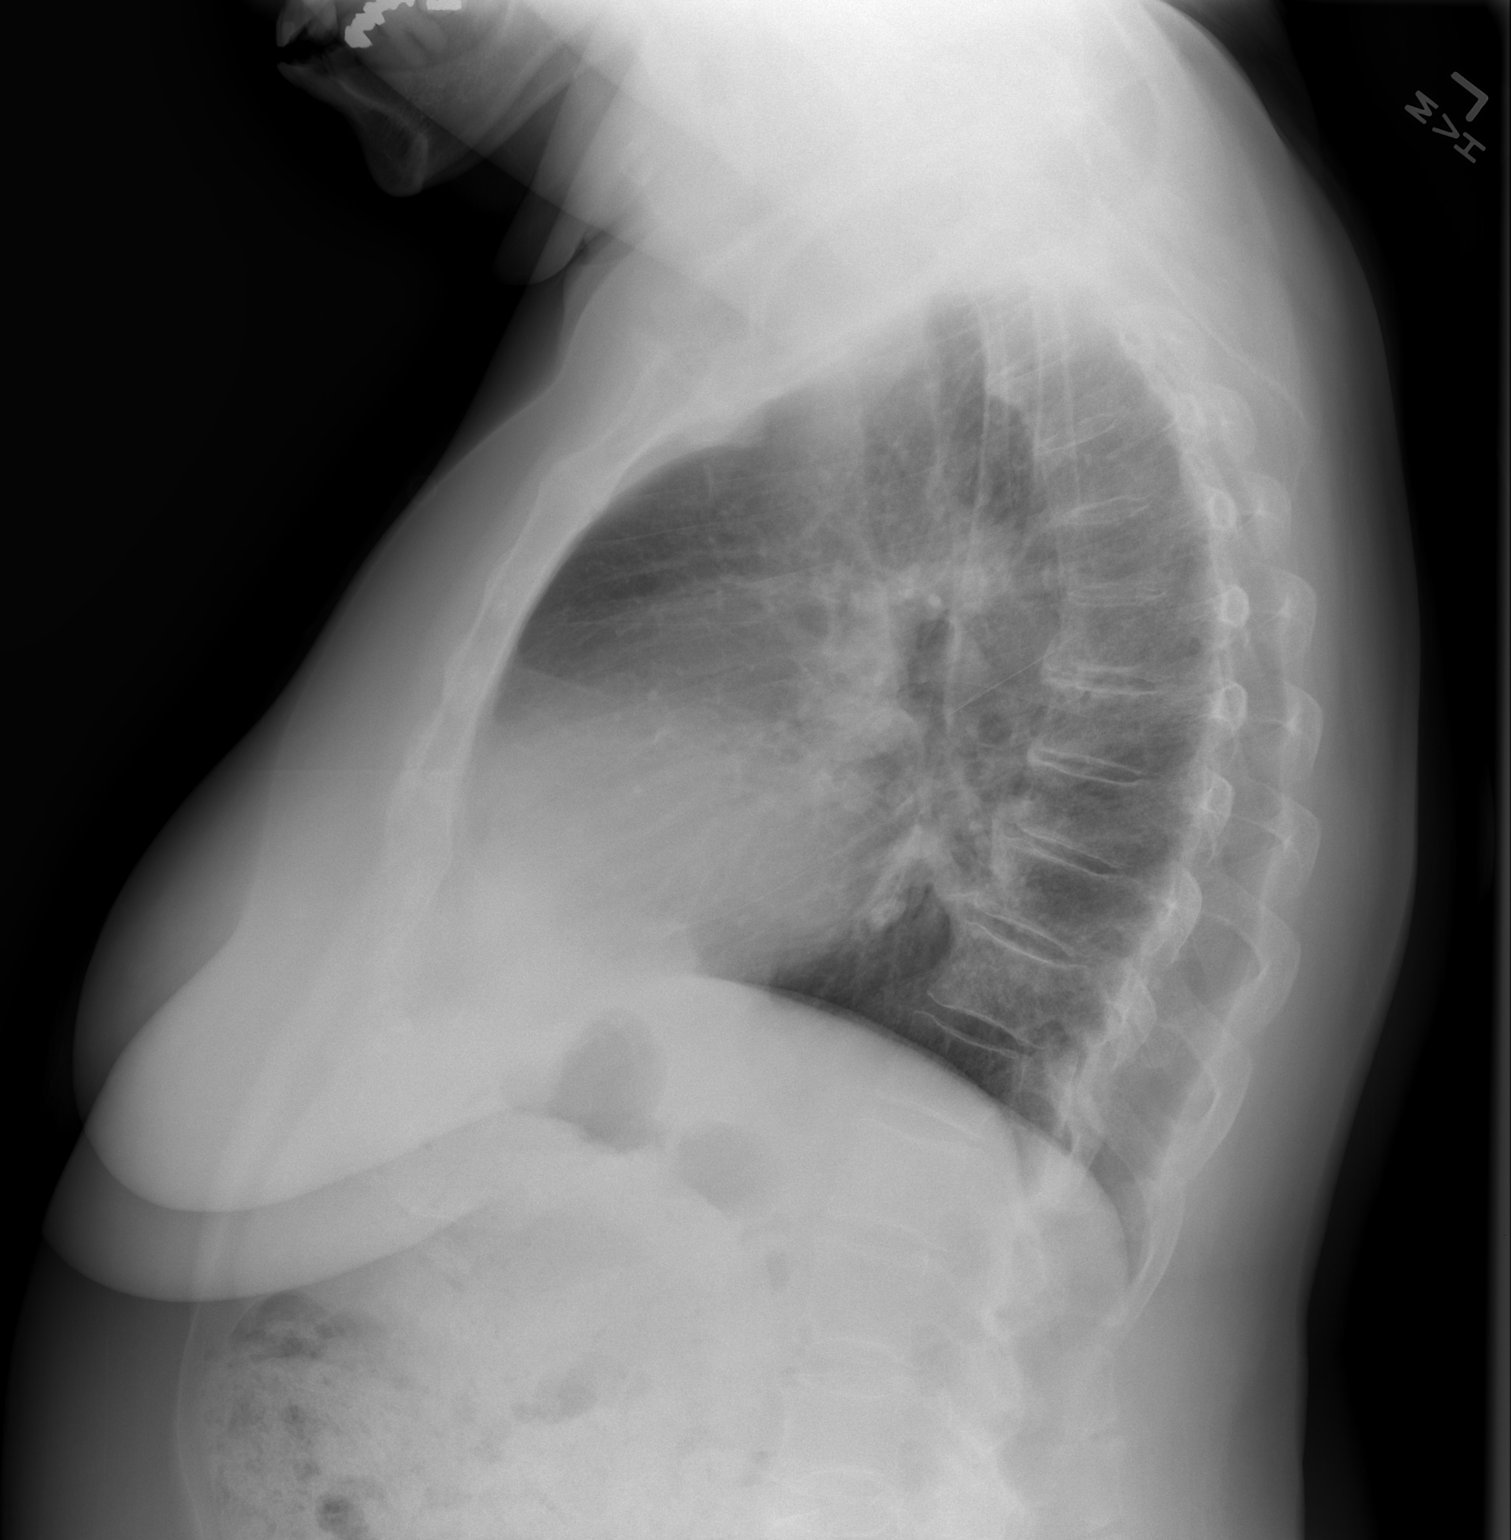

[2 of 2 positions shown; findings below may reference images not displayed]

FINDINGS: Heart size is upper limits normal. The lungs are free of focal
consolidations and pleural effusions. No pulmonary edema.
Degenerative changes are seen in the mid thoracic spine. Chronic
changes are seen in both shoulders.
IMPRESSION: No evidence for acute  abnormality.

## 2020-08-27 DEATH — deceased
